# Patient Record
Sex: Female | Born: 1960 | Race: White | Hispanic: No | Marital: Married | Smoking: Never smoker
Health system: Southern US, Community
[De-identification: ages and names within clinical notes are randomized; demographics above are authoritative.]

## PROBLEM LIST (undated history)

## (undated) DIAGNOSIS — E559 Vitamin D deficiency, unspecified: Secondary | ICD-10-CM

## (undated) DIAGNOSIS — N289 Disorder of kidney and ureter, unspecified: Secondary | ICD-10-CM

## (undated) DIAGNOSIS — E7800 Pure hypercholesterolemia, unspecified: Secondary | ICD-10-CM

## (undated) DIAGNOSIS — Z800 Family history of malignant neoplasm of digestive organs: Secondary | ICD-10-CM

## (undated) DIAGNOSIS — I10 Essential (primary) hypertension: Secondary | ICD-10-CM

## (undated) DIAGNOSIS — K219 Gastro-esophageal reflux disease without esophagitis: Secondary | ICD-10-CM

## (undated) HISTORY — DX: Family history of malignant neoplasm of digestive organs: Z80.0

## (undated) HISTORY — DX: Essential (primary) hypertension: I10

## (undated) HISTORY — PX: KNEE SURGERY: SHX244

## (undated) HISTORY — DX: Vitamin D deficiency, unspecified: E55.9

---

## 2012-09-18 HISTORY — PX: COLONOSCOPY: SHX174

## 2015-01-07 ENCOUNTER — Emergency Department (HOSPITAL_COMMUNITY)

## 2015-01-07 ENCOUNTER — Encounter (HOSPITAL_COMMUNITY): Payer: Self-pay | Admitting: Emergency Medicine

## 2015-01-07 ENCOUNTER — Emergency Department (HOSPITAL_COMMUNITY)
Admission: EM | Admit: 2015-01-07 | Discharge: 2015-01-07 | Disposition: A | Discharge status: Home / Self Care | Attending: Emergency Medicine | Admitting: Emergency Medicine

## 2015-01-07 DIAGNOSIS — R52 Pain, unspecified: Secondary | ICD-10-CM

## 2015-01-07 DIAGNOSIS — R109 Unspecified abdominal pain: Secondary | ICD-10-CM | POA: Diagnosis present

## 2015-01-07 DIAGNOSIS — Z3202 Encounter for pregnancy test, result negative: Secondary | ICD-10-CM | POA: Diagnosis not present

## 2015-01-07 DIAGNOSIS — N200 Calculus of kidney: Secondary | ICD-10-CM | POA: Diagnosis not present

## 2015-01-07 HISTORY — DX: Disorder of kidney and ureter, unspecified: N28.9

## 2015-01-07 LAB — COMPREHENSIVE METABOLIC PANEL
ALBUMIN: 4.5 g/dL (ref 3.5–5.0)
ALT: 18 U/L (ref 14–54)
ANION GAP: 9 (ref 5–15)
AST: 19 U/L (ref 15–41)
Alkaline Phosphatase: 42 U/L (ref 38–126)
BILIRUBIN TOTAL: 0.8 mg/dL (ref 0.3–1.2)
BUN: 12 mg/dL (ref 6–20)
CHLORIDE: 103 mmol/L (ref 101–111)
CO2: 25 mmol/L (ref 22–32)
Calcium: 9.3 mg/dL (ref 8.9–10.3)
Creatinine, Ser: 0.88 mg/dL (ref 0.44–1.00)
GFR calc Af Amer: >60 mL/min (ref >60)
GFR calc non Af Amer: >60 mL/min (ref >60)
GLUCOSE: 141 mg/dL — AB (ref 65–99)
POTASSIUM: 3.8 mmol/L (ref 3.5–5.1)
Sodium: 137 mmol/L (ref 135–145)
TOTAL PROTEIN: 7.8 g/dL (ref 6.5–8.1)

## 2015-01-07 LAB — URINALYSIS, ROUTINE W REFLEX MICROSCOPIC
BILIRUBIN URINE: NEGATIVE
Glucose, UA: NEGATIVE mg/dL
NITRITE: NEGATIVE
Protein, ur: NEGATIVE mg/dL
Specific Gravity, Urine: >1.03 — ABNORMAL HIGH (ref 1.005–1.030)
pH: 5.5 (ref 5.0–8.0)

## 2015-01-07 LAB — URINE MICROSCOPIC-ADD ON

## 2015-01-07 LAB — CBC
HCT: 40.7 % (ref 36.0–46.0)
HEMOGLOBIN: 13.9 g/dL (ref 12.0–15.0)
MCH: 29.8 pg (ref 26.0–34.0)
MCHC: 34.2 g/dL (ref 30.0–36.0)
MCV: 87.2 fL (ref 78.0–100.0)
Platelets: 295 10*3/uL (ref 150–400)
RBC: 4.67 MIL/uL (ref 3.87–5.11)
RDW: 12.6 % (ref 11.5–15.5)
WBC: 15.9 10*3/uL — AB (ref 4.0–10.5)

## 2015-01-07 LAB — LIPASE, BLOOD: LIPASE: 30 U/L (ref 11–51)

## 2015-01-07 LAB — PREGNANCY, URINE: Preg Test, Ur: NEGATIVE

## 2015-01-07 MED ORDER — HYDROMORPHONE HCL 1 MG/ML IJ SOLN
1.0000 mg | Freq: Once | INTRAMUSCULAR | Status: DC
  Filled 2015-01-07: qty 1

## 2015-01-07 MED ORDER — TAMSULOSIN HCL 0.4 MG PO CAPS: 0.4000 mg | ORAL_CAPSULE | Freq: Once | ORAL | Status: DC

## 2015-01-07 MED ORDER — KETOROLAC TROMETHAMINE 30 MG/ML IJ SOLN
30.0000 mg | Freq: Once | INTRAMUSCULAR | Status: AC
  Administered 2015-01-07: 30 mg via INTRAVENOUS
  Filled 2015-01-07: qty 1

## 2015-01-07 MED ORDER — ONDANSETRON 4 MG PO TBDP: 4.0000 mg | ORAL_TABLET | Freq: Three times a day (TID) | ORAL | Status: DC | PRN

## 2015-01-07 MED ORDER — ONDANSETRON HCL 4 MG/2ML IJ SOLN
4.0000 mg | Freq: Once | INTRAMUSCULAR | Status: AC | PRN
  Administered 2015-01-07: 4 mg via INTRAVENOUS
  Filled 2015-01-07: qty 2

## 2015-01-07 MED ORDER — HYDROMORPHONE HCL 1 MG/ML IJ SOLN
1.0000 mg | Freq: Once | INTRAMUSCULAR | Status: AC
  Administered 2015-01-07: 1 mg via INTRAVENOUS

## 2015-01-07 MED ORDER — OXYCODONE-ACETAMINOPHEN 5-325 MG PO TABS: 2.0000 | ORAL_TABLET | ORAL | Status: DC | PRN

## 2015-01-07 MED ORDER — FENTANYL CITRATE (PF) 100 MCG/2ML IJ SOLN
50.0000 ug | Freq: Once | INTRAMUSCULAR | Status: AC
  Administered 2015-01-07: 50 ug via INTRAVENOUS
  Filled 2015-01-07: qty 2

## 2015-01-07 MED ORDER — HYDROMORPHONE HCL 1 MG/ML IJ SOLN
1.0000 mg | Freq: Once | INTRAMUSCULAR | Status: AC
  Administered 2015-01-07: 1 mg via INTRAVENOUS
  Filled 2015-01-07: qty 1

## 2015-01-07 NOTE — ED Notes (Signed)
Pt in x-ray, unsure of pregnancy. Urine pregnancy ordered.

## 2015-01-07 NOTE — Discharge Instructions (Signed)
Kidney Stones °Kidney stones (urolithiasis) are deposits that form inside your kidneys. The intense pain is caused by the stone moving through the urinary tract. When the stone moves, the ureter goes into spasm around the stone. The stone is usually passed in the urine.  °CAUSES  °· A disorder that makes certain neck glands produce too much parathyroid hormone (primary hyperparathyroidism). °· A buildup of uric acid crystals, similar to gout in your joints. °· Narrowing (stricture) of the ureter. °· A kidney obstruction present at birth (congenital obstruction). °· Previous surgery on the kidney or ureters. °· Numerous kidney infections. °SYMPTOMS  °· Feeling sick to your stomach (nauseous). °· Throwing up (vomiting). °· Blood in the urine (hematuria). °· Pain that usually spreads (radiates) to the groin. °· Frequency or urgency of urination. °DIAGNOSIS  °· Taking a history and physical exam. °· Blood or urine tests. °· CT scan. °· Occasionally, an examination of the inside of the urinary bladder (cystoscopy) is performed. °TREATMENT  °· Observation. °· Increasing your fluid intake. °· Extracorporeal shock wave lithotripsy--This is a noninvasive procedure that uses shock waves to break up kidney stones. °· Surgery may be needed if you have severe pain or persistent obstruction. There are various surgical procedures. Most of the procedures are performed with the use of small instruments. Only small incisions are needed to accommodate these instruments, so recovery time is minimized. °The size, location, and chemical composition are all important variables that will determine the proper choice of action for you. Talk to your health care provider to better understand your situation so that you will minimize the risk of injury to yourself and your kidney.  °HOME CARE INSTRUCTIONS  °· Drink enough water and fluids to keep your urine clear or pale yellow. This will help you to pass the stone or stone fragments. °· Strain  all urine through the provided strainer. Keep all particulate matter and stones for your health care provider to see. The stone causing the pain may be as small as a grain of salt. It is very important to use the strainer each and every time you pass your urine. The collection of your stone will allow your health care provider to analyze it and verify that a stone has actually passed. The stone analysis will often identify what you can do to reduce the incidence of recurrences. °· Only take over-the-counter or prescription medicines for pain, discomfort, or fever as directed by your health care provider. °· Keep all follow-up visits as told by your health care provider. This is important. °· Get follow-up X-rays if required. The absence of pain does not always mean that the stone has passed. It may have only stopped moving. If the urine remains completely obstructed, it can cause loss of kidney function or even complete destruction of the kidney. It is your responsibility to make sure X-rays and follow-ups are completed. Ultrasounds of the kidney can show blockages and the status of the kidney. Ultrasounds are not associated with any radiation and can be performed easily in a matter of minutes. °· Make changes to your daily diet as told by your health care provider. You may be told to: °¨ Limit the amount of salt that you eat. °¨ Eat 5 or more servings of fruits and vegetables each day. °¨ Limit the amount of meat, poultry, fish, and eggs that you eat. °· Collect a 24-hour urine sample as told by your health care provider. You may need to collect another urine sample every 6-12   months. °SEEK MEDICAL CARE IF: °· You experience pain that is progressive and unresponsive to any pain medicine you have been prescribed. °SEEK IMMEDIATE MEDICAL CARE IF:  °· Pain cannot be controlled with the prescribed medicine. °· You have a fever or shaking chills. °· The severity or intensity of pain increases over 18 hours and is not  relieved by pain medicine. °· You develop a new onset of abdominal pain. °· You feel faint or pass out. °· You are unable to urinate. °  °This information is not intended to replace advice given to you by your health care provider. Make sure you discuss any questions you have with your health care provider. °  °Document Released: 01/25/2005 Document Revised: 10/16/2014 Document Reviewed: 06/28/2012 °Elsevier Interactive Patient Education ©2016 [employer] ° °

## 2015-01-07 NOTE — ED Notes (Signed)
Pt c/o increasingly worsening RT flank pain, n/v since last night. Pt hx of kidney stones. Denies urinary symptoms at this time.

## 2015-01-07 NOTE — ED Provider Notes (Signed)
CSN: [id number]     Arrival date & time 01/07/15  1038 History   First MD Initiated Contact with Patient 01/07/15 1109     Chief Complaint  Patient presents with  . Flank Pain     (Consider location/radiation/quality/duration/timing/severity/associated sxs/prior Treatment) Patient is a 54 y.o. female presenting with flank pain. The history is provided by the patient. No language interpreter was used.  Flank Pain This is a new problem. The current episode started yesterday. The problem occurs constantly. The problem has been gradually worsening. Associated symptoms include nausea and vomiting. Pertinent negatives include no abdominal pain. Nothing aggravates the symptoms. She has tried nothing for the symptoms. The treatment provided moderate relief.    Past Medical History  Diagnosis Date  . Renal disorder     kidney stones   Past Surgical History  Procedure Laterality Date  . Knee surgery     No family history on file. Social History  Substance Use Topics  . Smoking status: Never Smoker   . Smokeless tobacco: None  . Alcohol Use: Yes     Comment: occ   OB History    No data available     Review of Systems  Gastrointestinal: Positive for nausea and vomiting. Negative for abdominal pain.  Genitourinary: Positive for flank pain.  All other systems reviewed and are negative.     Allergies  Review of patient's allergies indicates no known allergies.  Home Medications   Prior to Admission medications   Not on File   BP 189/95 mmHg  Pulse 71  Temp(Src) 97.7 F (36.5 C) (Oral)  Resp 20  Ht 5\' 6"  (1.676 m)  Wt 86.183 kg  BMI 30.68 kg/m2  SpO2 100%  LMP 11/09/2014 Physical Exam  Constitutional: She is oriented to person, place, and time. She appears well-developed and well-nourished.  HENT:  Head: Normocephalic.  Eyes: EOM are normal.  Neck: Normal range of motion.  Cardiovascular: Normal rate.   Pulmonary/Chest: Effort normal.  Abdominal: Soft. She  exhibits no distension.  Tender right flank,  Slight diffuse tenderness right abdomen  Musculoskeletal: Normal range of motion.  Neurological: She is alert and oriented to person, place, and time.  Psychiatric: She has a normal mood and affect.  Nursing note and vitals reviewed.   ED Course  Procedures (including critical care time) Labs Review Labs Reviewed  LIPASE, BLOOD  COMPREHENSIVE METABOLIC PANEL  CBC  URINALYSIS, ROUTINE W REFLEX MICROSCOPIC (NOT [facility])    Imaging Review Ct Renal Stone Study  01/07/2015  CLINICAL DATA:  54 year old female with history of right-sided flank pain since yesterday evening. Nausea and vomiting. EXAM: CT ABDOMEN AND PELVIS WITHOUT CONTRAST TECHNIQUE: Multidetector CT imaging of the abdomen and pelvis was performed following the standard protocol without IV contrast. COMPARISON:  No priors. FINDINGS: Lower chest:  Unremarkable. Hepatobiliary: No cystic or solid hepatic lesions are confidently identified on today's noncontrast CT examination. The unenhanced appearance of the gallbladder is normal. Pancreas: No definite pancreatic mass or peripancreatic inflammatory changes are identified on today's noncontrast CT examination. Spleen: Unremarkable. Adrenals/Urinary Tract: There appears to be 2 stones adjacent to one another in the distal third of the right ureter best appreciated on image 67 of series 2, largest of which measures 5 mm. This is associated with moderate proximal hydroureteronephrosis and extensive perinephric stranding, indicative of obstruction at this time. No additional calcifications are identified within the collecting system of the left kidney, along the course of the left ureter or within the  lumen of the urinary bladder. The unenhanced appearance of the kidneys and urinary bladder is otherwise unremarkable. Bilateral adrenal glands are normal in appearance. Stomach/Bowel: Normal appearance of the stomach. No pathologic dilatation of small  bowel or colon. Normal appendix. Vascular/Lymphatic: Mild atherosclerotic calcifications identified throughout the abdominal and pelvic vasculature, without definite aneurysm. No lymphadenopathy noted in the abdomen or pelvis on today's noncontrast CT examination. Reproductive: Uterus and ovaries are unremarkable in appearance. Other: Trace volume of free fluid in the low anatomic pelvis, likely reactive to the right-sided urinary tract obstruction. No larger volume of ascites. No pneumoperitoneum. Musculoskeletal: There are no aggressive appearing lytic or blastic lesions noted in the visualized portions of the skeleton. IMPRESSION: 1. There are 2 calculi adjacent to one another in the distal third of the right ureter, largest of which measures 5 mm. These are obstructive associated with moderate proximal right hydroureteronephrosis and extensive perinephric stranding at this time. 2. Normal appendix. 3. Mild atherosclerosis. Electronically Signed   By: Trudie Reed M.D.   On: 01/07/2015 14:13   I have personally reviewed and evaluated these images and lab results as part of my medical decision-making.   EKG Interpretation None      MDM  Pt given fentynl by Rn no relief.   Pt given Iv dilaudid  x 2 dosages.   Pt given torodol iv.   Pt reports some relief.   I advised pt to follow up with Urology for recheck.   Rx for flomax, percocet and zofran.   Pt advised to return if any problems.   Final diagnoses:  Pain  Kidney stone on right side    An After Visit Summary was printed and given to the patient.    Lonia Skinner Ivyland, PA-C 01/08/15 1018  Blane Ohara, MD 01/11/15 [phone]

## 2015-01-11 ENCOUNTER — Inpatient Hospital Stay (HOSPITAL_COMMUNITY)
Admission: EM | Admit: 2015-01-11 | Discharge: 2015-01-15 | DRG: 872 | Disposition: A | Discharge status: Home / Self Care | Attending: Family Medicine | Admitting: Family Medicine

## 2015-01-11 ENCOUNTER — Encounter (HOSPITAL_COMMUNITY): Payer: Self-pay

## 2015-01-11 DIAGNOSIS — E876 Hypokalemia: Secondary | ICD-10-CM | POA: Diagnosis not present

## 2015-01-11 DIAGNOSIS — B9620 Unspecified Escherichia coli [E. coli] as the cause of diseases classified elsewhere: Secondary | ICD-10-CM | POA: Diagnosis present

## 2015-01-11 DIAGNOSIS — Z87442 Personal history of urinary calculi: Secondary | ICD-10-CM

## 2015-01-11 DIAGNOSIS — R509 Fever, unspecified: Secondary | ICD-10-CM | POA: Diagnosis present

## 2015-01-11 DIAGNOSIS — N136 Pyonephrosis: Secondary | ICD-10-CM | POA: Diagnosis not present

## 2015-01-11 DIAGNOSIS — A4151 Sepsis due to Escherichia coli [E. coli]: Principal | ICD-10-CM | POA: Diagnosis present

## 2015-01-11 DIAGNOSIS — N201 Calculus of ureter: Secondary | ICD-10-CM

## 2015-01-11 DIAGNOSIS — D649 Anemia, unspecified: Secondary | ICD-10-CM | POA: Diagnosis not present

## 2015-01-11 DIAGNOSIS — A419 Sepsis, unspecified organism: Secondary | ICD-10-CM

## 2015-01-11 DIAGNOSIS — N138 Other obstructive and reflux uropathy: Secondary | ICD-10-CM

## 2015-01-11 DIAGNOSIS — N179 Acute kidney failure, unspecified: Secondary | ICD-10-CM | POA: Diagnosis not present

## 2015-01-11 DIAGNOSIS — N200 Calculus of kidney: Secondary | ICD-10-CM

## 2015-01-12 ENCOUNTER — Emergency Department (HOSPITAL_COMMUNITY): Admitting: Anesthesiology

## 2015-01-12 ENCOUNTER — Encounter (HOSPITAL_COMMUNITY)
Admission: EM | Disposition: A | Discharge status: Home / Self Care | Payer: Self-pay | Source: Home / Self Care | Attending: Internal Medicine

## 2015-01-12 ENCOUNTER — Encounter (HOSPITAL_COMMUNITY): Payer: Self-pay | Admitting: *Deleted

## 2015-01-12 ENCOUNTER — Observation Stay (HOSPITAL_COMMUNITY)

## 2015-01-12 ENCOUNTER — Emergency Department (HOSPITAL_COMMUNITY)

## 2015-01-12 DIAGNOSIS — D649 Anemia, unspecified: Secondary | ICD-10-CM | POA: Diagnosis present

## 2015-01-12 DIAGNOSIS — N200 Calculus of kidney: Secondary | ICD-10-CM

## 2015-01-12 DIAGNOSIS — R7881 Bacteremia: Secondary | ICD-10-CM | POA: Diagnosis not present

## 2015-01-12 DIAGNOSIS — N179 Acute kidney failure, unspecified: Secondary | ICD-10-CM | POA: Diagnosis present

## 2015-01-12 DIAGNOSIS — N139 Obstructive and reflux uropathy, unspecified: Secondary | ICD-10-CM | POA: Diagnosis not present

## 2015-01-12 DIAGNOSIS — R000 Tachycardia, unspecified: Secondary | ICD-10-CM | POA: Diagnosis not present

## 2015-01-12 DIAGNOSIS — N138 Other obstructive and reflux uropathy: Secondary | ICD-10-CM

## 2015-01-12 DIAGNOSIS — B9620 Unspecified Escherichia coli [E. coli] as the cause of diseases classified elsewhere: Secondary | ICD-10-CM | POA: Diagnosis present

## 2015-01-12 DIAGNOSIS — A4151 Sepsis due to Escherichia coli [E. coli]: Secondary | ICD-10-CM | POA: Diagnosis present

## 2015-01-12 DIAGNOSIS — R509 Fever, unspecified: Secondary | ICD-10-CM | POA: Diagnosis not present

## 2015-01-12 DIAGNOSIS — E876 Hypokalemia: Secondary | ICD-10-CM | POA: Diagnosis present

## 2015-01-12 DIAGNOSIS — N136 Pyonephrosis: Secondary | ICD-10-CM | POA: Diagnosis present

## 2015-01-12 DIAGNOSIS — Z87442 Personal history of urinary calculi: Secondary | ICD-10-CM | POA: Diagnosis not present

## 2015-01-12 DIAGNOSIS — N201 Calculus of ureter: Secondary | ICD-10-CM | POA: Diagnosis not present

## 2015-01-12 HISTORY — PX: CYSTOSCOPY W/ URETERAL STENT PLACEMENT: SHX1429

## 2015-01-12 LAB — COMPREHENSIVE METABOLIC PANEL
ALT: 36 U/L (ref 14–54)
AST: 28 U/L (ref 15–41)
Albumin: 2.8 g/dL — ABNORMAL LOW (ref 3.5–5.0)
Alkaline Phosphatase: 134 U/L — ABNORMAL HIGH (ref 38–126)
Anion gap: 12 (ref 5–15)
BUN: 21 mg/dL — AB (ref 6–20)
CHLORIDE: 99 mmol/L — AB (ref 101–111)
CO2: 23 mmol/L (ref 22–32)
Calcium: 8.6 mg/dL — ABNORMAL LOW (ref 8.9–10.3)
Creatinine, Ser: 1.36 mg/dL — ABNORMAL HIGH (ref 0.44–1.00)
GFR calc Af Amer: 50 mL/min — ABNORMAL LOW (ref >60)
GFR, EST NON AFRICAN AMERICAN: 44 mL/min — AB (ref >60)
GLUCOSE: 103 mg/dL — AB (ref 65–99)
POTASSIUM: 3 mmol/L — AB (ref 3.5–5.1)
Sodium: 134 mmol/L — ABNORMAL LOW (ref 135–145)
Total Bilirubin: 1.8 mg/dL — ABNORMAL HIGH (ref 0.3–1.2)
Total Protein: 6.2 g/dL — ABNORMAL LOW (ref 6.5–8.1)

## 2015-01-12 LAB — CBC WITH DIFFERENTIAL/PLATELET
Basophils Absolute: 0 10*3/uL (ref 0.0–0.1)
Basophils Relative: 0 %
EOS PCT: 1 %
Eosinophils Absolute: 0.1 10*3/uL (ref 0.0–0.7)
HCT: 30.6 % — ABNORMAL LOW (ref 36.0–46.0)
Hemoglobin: 10.5 g/dL — ABNORMAL LOW (ref 12.0–15.0)
LYMPHS ABS: 0.4 10*3/uL — AB (ref 0.7–4.0)
LYMPHS PCT: 5 %
MCH: 29.3 pg (ref 26.0–34.0)
MCHC: 34.3 g/dL (ref 30.0–36.0)
MCV: 85.5 fL (ref 78.0–100.0)
MONOS PCT: 5 %
Monocytes Absolute: 0.5 10*3/uL (ref 0.1–1.0)
Neutro Abs: 7.9 10*3/uL — ABNORMAL HIGH (ref 1.7–7.7)
Neutrophils Relative %: 89 %
PLATELETS: 158 10*3/uL (ref 150–400)
RBC: 3.58 MIL/uL — AB (ref 3.87–5.11)
RDW: 12.7 % (ref 11.5–15.5)
WBC: 8.8 10*3/uL (ref 4.0–10.5)

## 2015-01-12 LAB — URINE MICROSCOPIC-ADD ON

## 2015-01-12 LAB — URINALYSIS, ROUTINE W REFLEX MICROSCOPIC
BILIRUBIN URINE: NEGATIVE
Glucose, UA: NEGATIVE mg/dL
NITRITE: NEGATIVE
pH: 6 (ref 5.0–8.0)

## 2015-01-12 LAB — BASIC METABOLIC PANEL
ANION GAP: 10 (ref 5–15)
BUN: 17 mg/dL (ref 6–20)
CALCIUM: 8.2 mg/dL — AB (ref 8.9–10.3)
CO2: 24 mmol/L (ref 22–32)
CREATININE: 1.23 mg/dL — AB (ref 0.44–1.00)
Chloride: 103 mmol/L (ref 101–111)
GFR calc Af Amer: 57 mL/min — ABNORMAL LOW (ref >60)
GFR, EST NON AFRICAN AMERICAN: 49 mL/min — AB (ref >60)
GLUCOSE: 116 mg/dL — AB (ref 65–99)
Potassium: 3.7 mmol/L (ref 3.5–5.1)
Sodium: 137 mmol/L (ref 135–145)

## 2015-01-12 LAB — I-STAT CG4 LACTIC ACID, ED: LACTIC ACID, VENOUS: 1.34 mmol/L (ref 0.5–2.0)

## 2015-01-12 SURGERY — CYSTOSCOPY, WITH RETROGRADE PYELOGRAM AND URETERAL STENT INSERTION
Anesthesia: General | Site: Ureter | Laterality: Right

## 2015-01-12 MED ORDER — SODIUM CHLORIDE 0.9 % IR SOLN
Status: DC | PRN
  Administered 2015-01-12: 3000 mL via INTRAVESICAL

## 2015-01-12 MED ORDER — PROPOFOL 10 MG/ML IV BOLUS
INTRAVENOUS | Status: DC | PRN
  Administered 2015-01-12: 140 mg via INTRAVENOUS

## 2015-01-12 MED ORDER — ROCURONIUM BROMIDE 50 MG/5ML IV SOLN
INTRAVENOUS | Status: AC
  Filled 2015-01-12: qty 1

## 2015-01-12 MED ORDER — OXYBUTYNIN CHLORIDE 5 MG PO TABS: 5.0000 mg | ORAL_TABLET | Freq: Three times a day (TID) | ORAL | Status: DC | PRN

## 2015-01-12 MED ORDER — LACTATED RINGERS IV SOLN
INTRAVENOUS | Status: DC | PRN
  Administered 2015-01-12: 03:00:00 via INTRAVENOUS

## 2015-01-12 MED ORDER — ROCURONIUM BROMIDE 100 MG/10ML IV SOLN
INTRAVENOUS | Status: DC | PRN
  Administered 2015-01-12: 5 mg via INTRAVENOUS

## 2015-01-12 MED ORDER — FENTANYL CITRATE (PF) 250 MCG/5ML IJ SOLN
INTRAMUSCULAR | Status: AC
  Filled 2015-01-12: qty 5

## 2015-01-12 MED ORDER — DEXTROSE 5 % IV SOLN
2.0000 g | Freq: Once | INTRAVENOUS | Status: AC
  Administered 2015-01-12: 2 g via INTRAVENOUS
  Filled 2015-01-12: qty 2

## 2015-01-12 MED ORDER — ONDANSETRON HCL 4 MG/2ML IJ SOLN
INTRAMUSCULAR | Status: DC | PRN
  Administered 2015-01-12: 4 mg via INTRAVENOUS

## 2015-01-12 MED ORDER — PIPERACILLIN-TAZOBACTAM 3.375 G IVPB
3.3750 g | Freq: Three times a day (TID) | INTRAVENOUS | Status: DC
  Administered 2015-01-12 – 2015-01-15 (×8): 3.375 g via INTRAVENOUS
  Filled 2015-01-12 (×11): qty 50

## 2015-01-12 MED ORDER — BISACODYL 5 MG PO TBEC: 5.0000 mg | DELAYED_RELEASE_TABLET | Freq: Every day | ORAL | Status: DC | PRN

## 2015-01-12 MED ORDER — MIDAZOLAM HCL 5 MG/5ML IJ SOLN
INTRAMUSCULAR | Status: DC | PRN
  Administered 2015-01-12: 2 mg via INTRAVENOUS

## 2015-01-12 MED ORDER — LIDOCAINE HCL (PF) 1 % IJ SOLN
INTRAMUSCULAR | Status: AC
  Filled 2015-01-12: qty 5

## 2015-01-12 MED ORDER — PROPOFOL 10 MG/ML IV BOLUS
INTRAVENOUS | Status: AC
  Filled 2015-01-12: qty 20

## 2015-01-12 MED ORDER — HYDROCODONE-ACETAMINOPHEN 5-325 MG PO TABS
1.0000 | ORAL_TABLET | ORAL | Status: DC | PRN
  Administered 2015-01-12 (×3): 1 via ORAL
  Filled 2015-01-12 (×3): qty 1

## 2015-01-12 MED ORDER — SUCCINYLCHOLINE CHLORIDE 20 MG/ML IJ SOLN
INTRAMUSCULAR | Status: DC | PRN
  Administered 2015-01-12: 100 mg via INTRAVENOUS

## 2015-01-12 MED ORDER — ALUM & MAG HYDROXIDE-SIMETH 200-200-20 MG/5ML PO SUSP: 30.0000 mL | Freq: Four times a day (QID) | ORAL | Status: DC | PRN

## 2015-01-12 MED ORDER — ONDANSETRON HCL 4 MG/2ML IJ SOLN: 4.0000 mg | Freq: Four times a day (QID) | INTRAMUSCULAR | Status: DC | PRN

## 2015-01-12 MED ORDER — PROPOFOL 10 MG/ML IV BOLUS: INTRAVENOUS | Status: DC | PRN

## 2015-01-12 MED ORDER — ONDANSETRON HCL 4 MG/2ML IJ SOLN
INTRAMUSCULAR | Status: AC
  Filled 2015-01-12: qty 2

## 2015-01-12 MED ORDER — POTASSIUM CHLORIDE IN NACL 20-0.9 MEQ/L-% IV SOLN
INTRAVENOUS | Status: AC
  Administered 2015-01-12: 23:00:00 via INTRAVENOUS

## 2015-01-12 MED ORDER — FENTANYL CITRATE (PF) 250 MCG/5ML IJ SOLN
INTRAMUSCULAR | Status: DC | PRN
  Administered 2015-01-12 (×2): 50 ug via INTRAVENOUS

## 2015-01-12 MED ORDER — IOHEXOL 350 MG/ML SOLN
INTRAVENOUS | Status: DC | PRN
  Administered 2015-01-12: 10 mL via URETHRAL

## 2015-01-12 MED ORDER — DEXTROSE 5 % IV SOLN
INTRAVENOUS | Status: AC
  Filled 2015-01-12: qty 10

## 2015-01-12 MED ORDER — ONDANSETRON HCL 4 MG PO TABS: 4.0000 mg | ORAL_TABLET | Freq: Four times a day (QID) | ORAL | Status: DC | PRN

## 2015-01-12 MED ORDER — LIDOCAINE HCL (CARDIAC) 20 MG/ML IV SOLN
INTRAVENOUS | Status: DC | PRN
  Administered 2015-01-12: 40 mg via INTRAVENOUS

## 2015-01-12 MED ORDER — PIPERACILLIN-TAZOBACTAM 3.375 G IVPB
INTRAVENOUS | Status: AC
  Filled 2015-01-12: qty 100

## 2015-01-12 MED ORDER — DEXTROSE 5 % IV SOLN
1.0000 g | INTRAVENOUS | Status: DC
  Filled 2015-01-12: qty 10

## 2015-01-12 MED ORDER — SUCCINYLCHOLINE CHLORIDE 20 MG/ML IJ SOLN
INTRAMUSCULAR | Status: AC
  Filled 2015-01-12: qty 1

## 2015-01-12 MED ORDER — ONDANSETRON 4 MG PO TBDP: 4.0000 mg | ORAL_TABLET | Freq: Three times a day (TID) | ORAL | Status: DC | PRN

## 2015-01-12 MED ORDER — OXYCODONE-ACETAMINOPHEN 5-325 MG PO TABS: 2.0000 | ORAL_TABLET | ORAL | Status: DC | PRN

## 2015-01-12 MED ORDER — MIDAZOLAM HCL 2 MG/2ML IJ SOLN
INTRAMUSCULAR | Status: AC
  Filled 2015-01-12: qty 2

## 2015-01-12 MED ORDER — POTASSIUM CHLORIDE IN NACL 20-0.9 MEQ/L-% IV SOLN
INTRAVENOUS | Status: AC
  Administered 2015-01-12: 06:00:00 via INTRAVENOUS

## 2015-01-12 SURGICAL SUPPLY — 18 items
BAG DRAIN URO TABLE W/ADPT NS (DRAPE) ×2 IMPLANT
BAG HAMPER (MISCELLANEOUS) ×2 IMPLANT
CATH INTERMIT  6FR 70CM (CATHETERS) ×2 IMPLANT
CLOTH BEACON ORANGE TIMEOUT ST (SAFETY) ×2 IMPLANT
DECANTER SPIKE VIAL GLASS SM (MISCELLANEOUS) ×2 IMPLANT
GLOVE BIOGEL M 8.0 STRL (GLOVE) ×2 IMPLANT
GLOVE BIOGEL PI IND STRL 7.0 (GLOVE) ×2 IMPLANT
GLOVE BIOGEL PI INDICATOR 7.0 (GLOVE) ×2
GLOVE ECLIPSE 6.5 STRL STRAW (GLOVE) ×2 IMPLANT
GOWN STRL REIN XL XLG (GOWN DISPOSABLE) ×2 IMPLANT
GUIDEWIRE STR DUAL SENSOR (WIRE) ×2 IMPLANT
IV NS IRRIG 3000ML ARTHROMATIC (IV SOLUTION) ×2 IMPLANT
KIT ROOM TURNOVER AP CYSTO (KITS) ×2 IMPLANT
MANIFOLD NEPTUNE II (INSTRUMENTS) ×2 IMPLANT
PACK CYSTO (CUSTOM PROCEDURE TRAY) ×2 IMPLANT
PAD ARMBOARD 7.5X6 YLW CONV (MISCELLANEOUS) ×2 IMPLANT
STENT CONTOUR 6FRX24X.038 (STENTS) ×2 IMPLANT
TOWEL OR 17X26 4PK STRL BLUE (TOWEL DISPOSABLE) ×2 IMPLANT

## 2015-01-12 NOTE — ED Provider Notes (Signed)
CSN: [id number]     Arrival date & time 01/11/15  2334 History  By signing my name below, I, Carolyn Patrick, attest that this documentation has been prepared under the direction and in the presence of Zadie Rhine, MD. Electronically Signed: Bethel Patrick, ED Scribe. 01/12/2015. 12:38 AM   Chief Complaint - fever  Patient is a 54 y.o. female presenting with fever. The history is provided by the spouse. No language interpreter was used.  Fever Max temp prior to arrival:  105 Temp source:  Oral Timing:  Constant Progression:  Improving Chronicity:  New Relieved by:  Ibuprofen Worsened by:  Nothing tried Ineffective treatments:  None tried Associated symptoms: chills, headaches, nausea and vomiting   Associated symptoms: no diarrhea    Carolyn Patrick is a 54 y.o. female who presents to the Emergency Department complaining of fever up to 105 with onset tonight. Pt states that she used ibuprofen PTA. She was seen for kidney stones 5 days ago and states that she has had chills since then. Her flank and abdominal pain have improved since being seen and is rated 4/10 in severity at present.  Associated symptoms include fatigue, decreased appetite, nausea, vomiting, and headache.  She is currently menstruating and unable to determine if she has hematuria. Pt denies diarrhea.  In the past she was able to pass kidney stones without difficulty. Pt drank water on the way to the ED and last ate yesterday. She has no local urology f/u.   Past Medical History  Diagnosis Date  . Renal disorder     kidney stones   Past Surgical History  Procedure Laterality Date  . Knee surgery     No family history on file. Social History  Substance Use Topics  . Smoking status: Never Smoker   . Smokeless tobacco: None  . Alcohol Use: Yes     Comment: occ   OB History    No data available     Review of Systems  Constitutional: Positive for fever, chills, appetite change and fatigue.  Gastrointestinal:  Positive for nausea, vomiting and abdominal pain. Negative for diarrhea.  Genitourinary: Positive for flank pain.  Neurological: Positive for headaches.  All other systems reviewed and are negative.  Allergies  Review of patient's allergies indicates no known allergies.  Home Medications   Prior to Admission medications   Medication Sig Start Date End Date Taking? Authorizing Provider  ondansetron (ZOFRAN ODT) 4 MG disintegrating tablet Take 1 tablet (4 mg total) by mouth every 8 (eight) hours as needed for nausea or vomiting. 01/07/15  Yes Elson Areas, PA-C  oxyCODONE-acetaminophen (PERCOCET/ROXICET) 5-325 MG tablet Take 2 tablets by mouth every 4 (four) hours as needed for severe pain. 01/07/15  Yes Lonia Skinner Sofia, PA-C  tamsulosin (FLOMAX) 0.4 MG CAPS capsule Take 1 capsule (0.4 mg total) by mouth once. 01/07/15  Yes Lonia Skinner Sofia, PA-C  bisacodyl (DULCOLAX) 5 MG EC tablet Take 5 mg by mouth daily as needed for mild constipation.    Historical Provider, MD  hydrochlorothiazide (HYDRODIURIL) 25 MG tablet Take 25 mg by mouth daily.    Historical Provider, MD   BP 127/87 mmHg  Pulse 132  Temp(Src) 100.2 F (37.9 C) (Oral)  Resp 20  Ht  (1.676 m)  Wt 190 lb (86.183 kg)  BMI 30.68 kg/m2  SpO2 100%  LMP 11/09/2014  Physical Exam CONSTITUTIONAL: ill apearing HEAD: Normocephalic/atraumatic EYES: EOMI ENMT: Mucous membranes dry NECK: supple no meningeal signs SPINE/BACK:entire spine nontender  CV: S1/S2 noted, no murmurs/rubs/gallops noted LUNGS: Lungs are clear to auscultation bilaterally, no apparent distress ABDOMEN: soft, nontender, no rebound or guarding, bowel sounds noted throughout abdomen ZO:XWRUEGU:Right cva tenderness NEURO: Pt is awake/alert/appropriate, moves all extremitiesx4.  No facial droop.   EXTREMITIES: pulses normal/equal, full ROM SKIN: warm, color normal PSYCH: no abnormalities of mood noted, alert and oriented to situation  ED Course  Procedures   Medications  cefTRIAXone (ROCEPHIN) 2 g in dextrose 5 % 50 mL IVPB (2 g Intravenous New Bag/Given 01/12/15 0036)     DIAGNOSTIC STUDIES: Oxygen Saturation is 100% on RA,  normal by my interpretation.    COORDINATION OF CARE: 12:34 AM Discussed treatment plan which includes lab work, Rocephin, and IVF with pt at bedside and pt agreed to plan.   2:14 AM discussed case with dr Retta Dionesdahlstedt who has seen patient Pt with recent ureteral stone on right, now with fever/chills/flank pain. Concern for infected stone IV rocephin ordered and cultures sent Pt will be admitted and likely go to OR with urology Pt stabilized in the ER Labs Review Labs Reviewed  URINALYSIS, ROUTINE W REFLEX MICROSCOPIC (NOT AT [facility]) - Abnormal; Notable for the following:    Specific Gravity, Urine <1.005 (*)    Hgb urine dipstick LARGE (*)    Ketones, ur TRACE (*)    Protein, ur TRACE (*)    Leukocytes, UA SMALL (*)    All other components within normal limits  URINE MICROSCOPIC-ADD ON - Abnormal; Notable for the following:    Squamous Epithelial / LPF 0-5 (*)    Bacteria, UA FEW (*)    All other components within normal limits  CULTURE, BLOOD (ROUTINE X 2)  CULTURE, BLOOD (ROUTINE X 2)  URINE CULTURE  COMPREHENSIVE METABOLIC PANEL  CBC WITH DIFFERENTIAL/PLATELET  I-STAT CG4 LACTIC ACID, ED    I have personally reviewed and evaluated these lab results as part of my medical decision-making.    MDM   Final diagnoses:  Sepsis, due to unspecified organism [facility](HCC)  Right ureteral stone    Nursing notes including past medical history and social history reviewed and considered in documentation Labs/vital reviewed myself and considered during evaluation    I personally performed the services described in this documentation, which was scribed in my presence. The recorded information has been reviewed and is accurate.       Zadie Rhineonald Kazoua Gossen, MD 01/12/15 [phone]

## 2015-01-12 NOTE — ED Notes (Signed)
Dr Hillis Rangeahlstadt here to see pt for procedure

## 2015-01-12 NOTE — ED Notes (Signed)
SCD's applied to pt's legs

## 2015-01-12 NOTE — Progress Notes (Signed)
CRITICAL VALUE ALERT  Critical value received:  + BC Aerobic Gram Negative Rods  Date of notification:  01/12/15  Time of notification:  1914  Critical value read back:Yes.    Nurse who received alert:  Dagoberto LigasJessica Anelle Parlow, RN  MD notified (1st page):  L. [place], Mid Level  Time of first page:  1923  MD notified (2nd page):  Time of second page:  Responding MD:    Time MD responded:

## 2015-01-12 NOTE — Anesthesia Postprocedure Evaluation (Signed)
Anesthesia Post Note  Patient: Carolyn Patrick  Procedure(s) Performed: Procedure(s) (LRB): CYSTOSCOPY WITH RETROGRADE PYELOGRAM/URETERAL STENT PLACEMENT (Right)  Patient location during evaluation: PACU Anesthesia Type: General Level of consciousness: awake and alert and oriented Pain management: pain level controlled Vital Signs Assessment: post-procedure vital signs reviewed and stable Respiratory status: spontaneous breathing Cardiovascular status: blood pressure returned to baseline and stable Postop Assessment: adequate PO intake Anesthetic complications: no    Last Vitals:  Filed Vitals:   01/12/15 0300 01/12/15 0400  BP: 118/68 104/67  Pulse: 99 97  Temp:  36.9 C  Resp: 23 20    Last Pain:  Filed Vitals:   01/12/15 0409  PainSc: 4                  Kaelon Weekes

## 2015-01-12 NOTE — H&P (Signed)
PCP:   No primary care provider on file.   Chief Complaint:  fever  HPI: 54 yo female h/o kidney stones was diagnosed with a stone couple of days ago, thought it would pass.  On Saturday she started having high fever over 104 with associated chills, and feeling awful.  She still had the right sided abd pain with right flank pain.  No n/v.  Found to have obstructing stone on the right with mild acute kidney injury.  Urology came in and placed stent right ureter.    Review of Systems:  Positive and negative as per HPI otherwise all other systems are negative  Past Medical History: Past Medical History  Diagnosis Date  . Renal disorder     kidney stones   Past Surgical History  Procedure Laterality Date  . Knee surgery      Medications: Prior to Admission medications   Medication Sig Start Date End Date Taking? Authorizing Provider  ondansetron (ZOFRAN ODT) 4 MG disintegrating tablet Take 1 tablet (4 mg total) by mouth every 8 (eight) hours as needed for nausea or vomiting. 01/07/15  Yes Elson Areas, PA-C  oxyCODONE-acetaminophen (PERCOCET/ROXICET) 5-325 MG tablet Take 2 tablets by mouth every 4 (four) hours as needed for severe pain. 01/07/15  Yes Lonia Skinner Sofia, PA-C  tamsulosin (FLOMAX) 0.4 MG CAPS capsule Take 1 capsule (0.4 mg total) by mouth once. 01/07/15  Yes Lonia Skinner Sofia, PA-C  bisacodyl (DULCOLAX) 5 MG EC tablet Take 5 mg by mouth daily as needed for mild constipation.    Historical Provider, MD  hydrochlorothiazide (HYDRODIURIL) 25 MG tablet Take 25 mg by mouth daily.    Historical Provider, MD    Allergies:  No Known Allergies  Social History:  reports that she has never smoked. She does not have any smokeless tobacco history on file. She reports that she drinks alcohol. She reports that she does not use illicit drugs.  Family History: No premature CAD  Physical Exam: Filed Vitals:   01/12/15 0245 01/12/15 0300 01/12/15 0400 01/12/15 0450  BP:  118/68  104/67 125/63  Pulse: 99 99 97 95  Temp:   98.4 F (36.9 C) 98.4 F (36.9 C)  TempSrc:    Oral  Resp: Height:     (1.676 m)  Weight:    102.558 kg (226 lb 1.6 oz)  SpO2: 96% 96% 100% 97%   General appearance: alert, cooperative and no distress Head: Normocephalic, without obvious abnormality, atraumatic Eyes: negative Nose: Nares normal. Septum midline. Mucosa normal. No drainage or sinus tenderness. Neck: no JVD and supple, symmetrical, trachea midline Lungs: clear to auscultation bilaterally Heart: regular rate and rhythm, S1, S2 normal, no murmur, click, rub or gallop Abdomen: soft, non-tender; bowel sounds normal; no masses,  no organomegaly Extremities: extremities normal, atraumatic, no cyanosis or edema Pulses: 2+ and symmetric Skin: Skin color, texture, turgor normal. No rashes or lesions Neurologic: Grossly normal    Labs on Admission:   Recent Labs  01/12/15 0155  NA 134*  K 3.0*  CL 99*  CO2 23  GLUCOSE 103*  BUN 21*  CREATININE 1.36*  CALCIUM 8.6*    Recent Labs  01/12/15 0155  AST 28  ALT 36  ALKPHOS 134*  BILITOT 1.8*  PROT 6.2*  ALBUMIN 2.8*    Recent Labs  01/12/15 0155  WBC 8.8  NEUTROABS 7.9*  HGB 10.5*  HCT 30.6*  MCV 85.5  PLT 158   Radiological  Exams on Admission: Dg Chest Port 1 View  01/12/2015  CLINICAL DATA:  Fever, possible kidney stone. EXAM: PORTABLE CHEST 1 VIEW COMPARISON:  None. FINDINGS: Cardiomediastinal silhouette is unremarkable for this low inspiratory portable examination with crowded vasculature markings. The lungs are clear without pleural effusions or focal consolidations. Trachea projects midline and there is no pneumothorax. Included soft tissue planes and osseous structures are non-suspicious. Tiny calcification projecting superior to the LEFT humeral head can be seen with calcific tendinopathy. IMPRESSION: No active disease. Electronically Signed   By: Awilda Metroourtnay  Bloomer M.D.   On: 01/12/2015  02:51   Ct Renal Stone Study  01/07/2015  CLINICAL DATA:  54 year old female with history of right-sided flank pain since yesterday evening. Nausea and vomiting. EXAM: CT ABDOMEN AND PELVIS WITHOUT CONTRAST TECHNIQUE: Multidetector CT imaging of the abdomen and pelvis was performed following the standard protocol without IV contrast. COMPARISON:  No priors. FINDINGS: Lower chest:  Unremarkable. Hepatobiliary: No cystic or solid hepatic lesions are confidently identified on today's noncontrast CT examination. The unenhanced appearance of the gallbladder is normal. Pancreas: No definite pancreatic mass or peripancreatic inflammatory changes are identified on today's noncontrast CT examination. Spleen: Unremarkable. Adrenals/Urinary Tract: There appears to be 2 stones adjacent to one another in the distal third of the right ureter best appreciated on image 67 of series 2, largest of which measures 5 mm. This is associated with moderate proximal hydroureteronephrosis and extensive perinephric stranding, indicative of obstruction at this time. No additional calcifications are identified within the collecting system of the left kidney, along the course of the left ureter or within the lumen of the urinary bladder. The unenhanced appearance of the kidneys and urinary bladder is otherwise unremarkable. Bilateral adrenal glands are normal in appearance. Stomach/Bowel: Normal appearance of the stomach. No pathologic dilatation of small bowel or colon. Normal appendix. Vascular/Lymphatic: Mild atherosclerotic calcifications identified throughout the abdominal and pelvic vasculature, without definite aneurysm. No lymphadenopathy noted in the abdomen or pelvis on today's noncontrast CT examination. Reproductive: Uterus and ovaries are unremarkable in appearance. Other: Trace volume of free fluid in the low anatomic pelvis, likely reactive to the right-sided urinary tract obstruction. No larger volume of ascites. No  pneumoperitoneum. Musculoskeletal: There are no aggressive appearing lytic or blastic lesions noted in the visualized portions of the skeleton. IMPRESSION: 1. There are 2 calculi adjacent to one another in the distal third of the right ureter, largest of which measures 5 mm. These are obstructive associated with moderate proximal right hydroureteronephrosis and extensive perinephric stranding at this time. 2. Normal appendix. 3. Mild atherosclerosis. Electronically Signed   By: Trudie Reedaniel  Entrikin M.D.   On: 01/07/2015 14:13   Case discussed with urologist dr dalstedht   Assessment/Plan  54 yo female with obstructing right ureteral stone with AKI s/p ureteral stent placement  Principal Problem:   Urinary tract obstruction due to kidney stone-  Will likely early sepsis/bacteremia.  Place on iv rocephin.  obs on medical bed.  Urine cx pending.  Follow up per urology for eventual stent removal.  Active Problems:   AKI (acute kidney injury) (HCC)-  IVF overnight.  Should normalize.  obs on medical.  Full code.    Amahd Morino A 01/12/2015, 5:04 AM

## 2015-01-12 NOTE — Anesthesia Procedure Notes (Signed)
Procedure Name: Intubation Date/Time: 01/12/2015 3:27 AM Performed by: Carolyn Patrick, Carolyn Patrick Pre-anesthesia Checklist: Patient identified, Patient being monitored, Timeout performed, Emergency Drugs available and Suction available Patient Re-evaluated:Patient Re-evaluated prior to inductionOxygen Delivery Method: Circle System Utilized Preoxygenation: Pre-oxygenation with 100% oxygen Intubation Type: IV induction, Rapid sequence and Cricoid Pressure applied Ventilation: Mask ventilation without difficulty Laryngoscope Size: Mac and 3 Grade View: Grade I Tube type: Oral Tube size: 7.0 mm Number of attempts: 1 Airway Equipment and Method: Stylet Placement Confirmation: ETT inserted through vocal cords under direct vision,  positive ETCO2 and breath sounds checked- equal and bilateral Secured at: 21 cm Tube secured with: Tape Dental Injury: Teeth and Oropharynx as per pre-operative assessment

## 2015-01-12 NOTE — Transfer of Care (Signed)
Immediate Anesthesia Transfer of Care Note  Patient: Carolyn Patrick  Procedure(s) Performed: Procedure(s): CYSTOSCOPY WITH RETROGRADE PYELOGRAM/URETERAL STENT PLACEMENT (Right)  Patient Location: PACU  Anesthesia Type:General  Level of Consciousness: awake  Airway & Oxygen Therapy: Patient Spontanous Breathing and Patient connected to face mask oxygen  Post-op Assessment: Report given to RN  Post vital signs: Reviewed and stable  Last Vitals:  Filed Vitals:   01/12/15 0200 01/12/15 0230  BP: 105/66 108/67  Pulse: 105 98  Temp:    Resp: 21 26    Complications: No apparent anesthesia complications

## 2015-01-12 NOTE — Op Note (Signed)
Preoperative diagnosis: Right ureteral calculi, fever, possible UTI  Postoperative diagnosis: Same  Principal procedure: Cystoscopy, right retrograde ureteropyelogram, right double-J stent placement (6 [place] by 24 cm)  Surgeon: Retta Dionesahlstedt  Anesthesia: Gen.  Complications: None  Specimens: None  History: 54 year old female with known right distal ureteral stone, treated here about 5 days ago. She was sent home with the plan of medical expulsive therapy. She has developed recent fever, up to 104 at home. She is a additionally had tachycardia and persistent pain. The patient presented again to the emergency room here in [place] several hours ago, and assessment revealed persistent pain. She does have tachycardia and slight leukocytosis. Because of her fever and the possibility of an infection with an obstructing stone, it was recommended that we proceed urgently with cystoscopy and stent placement. Risks and complications as well as alternatives have been discussed with the patient and her husband, who agreed to proceed.  Procedure: The patient had received 1 g of Rocephin intravenously in the emergency room. Her right side was appropriately marked. She was then taken the operating room where general anesthetic was administered. She was placed in the dorsolithotomy position. Genitalia and perineum were prepped and draped. Proper timeout was performed.  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 I then dilated the meatus with the obturator of the scope, then more easily passed the scope. The bladder was then inspected circumferentially with the 12 lens. It appeared normal. Ureteral orifices were normal in configuration and location. There are no urothelial lesions or stones.  I then performed gentle retrograde ureteropyelogram on the right using a 6 [place] open-ended catheter with Omnipaque. There was an obvious filling defect right at  the ureterovesical junction. There was significant proximal hydroureter. I emptied over 10 mL of Omnipaque in the ureter without filling the proximal half, however I stopped at this point as the patient did have hydronephrosis.  Through the open-ended catheter, I advanced a 0.038 inch sensor-tip guidewire up in the right renal pelvis using fluoroscopic guidance. The open-ended catheter was removed and then I placed a 24 cm x 6 French contour double-J stent without the string. Proximal and distal curls were then seen properly after the wire was removed. I then noted pus coming out of the distal end of the stent.  The bladder was drained, the scope removed, and the procedure was terminated. The patient was awakened and then taken to the PACU in stable condition. She tolerated the procedure well.

## 2015-01-12 NOTE — Anesthesia Preprocedure Evaluation (Signed)
Anesthesia Evaluation  Patient identified by MRN, date of birth, ID band Patient awake    Reviewed: Allergy & Precautions, NPO status , Patient's Chart, lab work & pertinent test results  History of Anesthesia Complications Negative for: history of anesthetic complications  Airway Mallampati: I  TM Distance: >3 FB Neck ROM: Full    Dental  (+) Teeth Intact   Pulmonary neg pulmonary ROS,    breath sounds clear to auscultation       Cardiovascular Exercise Tolerance: Good  Rhythm:Regular Rate:Normal     Neuro/Psych    GI/Hepatic Neg liver ROS, neg GERD  ,  Endo/Other    Renal/GU    Renal calculi    Musculoskeletal   Abdominal   Peds  Hematology   Anesthesia Other Findings   Reproductive/Obstetrics                             Anesthesia Physical Anesthesia Plan  ASA: II and emergent  Anesthesia Plan: General   Post-op Pain Management:    Induction: Intravenous, Rapid sequence and Cricoid pressure planned  Airway Management Planned: Oral ETT  Additional Equipment:   Intra-op Plan:   Post-operative Plan: Extubation in OR  Informed Consent:   Plan Discussed with: Surgeon  Anesthesia Plan Comments:         Anesthesia Quick Evaluation

## 2015-01-12 NOTE — H&P (Signed)
Urology Consult   Physician requesting consult: Zadie Rhineonald Wickline, M.D.  Reason for consult: Kidney stone  History of Present Illness: Carolyn Patrick is a [age] y.o. female who presented to the emergency room here and [place] approximately 3 hours ago with history of kidney stone, shakes, chills, right flank pain, fever to 104 at home. She had a temperature over 100 here in the emergency room with associated tachycardia. The patient was seen approximately 5 days ago in the same emergency room with a new diagnosis of right ureteral calculi. She had mild amount of bacteria in her urine but no other sign of a kidney/bladder infection. She was sent home appropriately on pain medicine. She has had intermittent flank pain since that time, but has recently developed shakes and chills and her associated fever. She is probably taking the emergency room at this time for cystoscopy, retrograde ureteropyelogram and stent placement for appropriate right renal drainage and a hydronephrotic system with probable urinary tract infection.  She does have a remote history of kidney stones, but has not had any stones within the past few years. She has no underlying medical illnesses.  Past Medical History  Diagnosis Date  . Renal disorder     kidney stones    Past Surgical History  Procedure Laterality Date  . Knee surgery       Current Hospital Medications: Scheduled Meds: Continuous Infusions: PRN Meds:.  Allergies: No Known Allergies  No family history on file.  Social History:  reports that she has never smoked. She does not have any smokeless tobacco history on file. She reports that she drinks alcohol. She reports that she does not use illicit drugs.  ROS: A complete review of systems was performed.  All systems are negative except for pertinent findings as noted.  Physical Exam:  Vital signs in last 24 hours: Temp:  [100.2 F (37.9 C)] 100.2 F (37.9 C) (12/03 2356) Pulse Rate:  [132] 132  (12/03 2356) Resp:  [20] 20 (12/03 2356) BP: (127)/(87) 127/87 mmHg (12/03 2356) SpO2:  [100 %] 100 % (12/03 2356) Weight:  [86.183 kg (190 lb)] 86.183 kg (190 lb) (12/03 2356) General:  Alert and oriented, in mild distress HEENT: Normocephalic, atraumatic Neck: No JVD or lymphadenopathy Cardiovascular: Normal rhythm, tachycardic Lungs: Clear bilaterally with normal inspiratory and expiratory excursion Abdomen: Soft, slightly obese. Right lower quadrant and CVA tenderness. No rebound or guarding. Extremities: No edema Neurologic: Grossly intact  Laboratory Data:  No results for input(s): WBC, HGB, HCT, PLT in the last 72 hours.  No results for input(s): NA, K, CL, GLUCOSE, BUN, CALCIUM, CREATININE in the last 72 hours.  Invalid input(s): CO3   Results for orders placed or performed during the hospital encounter of 01/11/15 (from the past 24 hour(s))  Urinalysis, Routine w reflex microscopic ([facility])     Status: Abnormal   Collection Time: 01/12/15 12:06 AM  Result Value Ref Range   Color, Urine YELLOW YELLOW   APPearance CLEAR CLEAR   Specific Gravity, Urine <1.005 (L) 1.005 - 1.030   pH 6.0 5.0 - 8.0   Glucose, UA NEGATIVE NEGATIVE mg/dL   Hgb urine dipstick LARGE (A) NEGATIVE   Bilirubin Urine NEGATIVE NEGATIVE   Ketones, ur TRACE (A) NEGATIVE mg/dL   Protein, ur TRACE (A) NEGATIVE mg/dL   Nitrite NEGATIVE NEGATIVE   Leukocytes, UA SMALL (A) NEGATIVE  Urine microscopic-add on     Status: Abnormal   Collection Time: 01/12/15 12:06 AM  Result Value Ref Range  Squamous Epithelial / LPF 0-5 (A) NONE SEEN   WBC, UA 0-5 0 - 5 WBC/hpf   RBC / HPF 6-30 0 - 5 RBC/hpf   Bacteria, UA FEW (A) NONE SEEN  I-Stat CG4 Lactic Acid, ED  (not at  [facility])     Status: None   Collection Time: 01/12/15  1:08 AM  Result Value Ref Range   Lactic Acid, Venous 1.34 0.5 - 2.0 mmol/L   No results found for this or any previous visit (from the past 240 hour(s)).  Renal Function:  Recent  Labs  01/07/15 1101  CREATININE 0.88   Estimated Creatinine Clearance: 81.8 mL/min (by C-G formula based on Cr of 0.88).  Radiologic Imaging: I reviewed most recent CT images with the patient and her husband. There is a 5-6 mm right distal ureteral stone on prior CT scan from earlier in the week.   I independently reviewed the above imaging studies.  Impression/Assessment:  Right ureteral stone, persistent with new onset fever and tachycardia. Although urine does not look infected, I am concerned that she has pyelonephritis in her obstructed right renal system  Plan:  I've discussed urgent management with the patient and her husband, who was present for the exam and evaluation today. I would suggest we proceed at this point with cystoscopy, general right retrograde ureteropyelogram and double-J stent placement. The need for hospitalization for at least a short period of time postoperatively was discussed as well. They agree to the procedure, having been instructed on the option of percutaneous nephrostomy tube placement, as well as the low risk of ureteral injury and anesthetic administration.

## 2015-01-12 NOTE — Progress Notes (Signed)
[facility] PROGRESS NOTE  Carolyn Patrick ZOX:[id number] DOB: 1960-03-19 DOA: 01/11/2015  PCP: No primary care provider on file.  Brief HPI: 54 year old Caucasian female with a past medical history of nephrolithiasis who presented to the emergency department with the flank pain a few days ago. She was diagnosed as having stones in the right ureter. Patient was treated symptomatically and was discharged from the emergency department. However, she presented again due to persistent pain as well as fever of 104F. She was seen by urology and underwent a stent placement and was hospitalized for further management.  Past medical history:  Past Medical History  Diagnosis Date  . Renal disorder     kidney stones    Consultants: Urology  Procedures: Cystoscopy, right retrograde ureteropyelogram, right double-J stent placement (6 [place] by 24 cm) 12/4  Antibiotics: Ceftriaxone  Subjective: Patient feels much better this morning. No nausea. Pain is improved. Passing urine. Eating her breakfast this morning.  Objective: Vital Signs  Filed Vitals:   01/12/15 0245 01/12/15 0300 01/12/15 0400 01/12/15 0450  BP:  118/68 104/67 125/63  Pulse: 99 99 97 95  Temp:   98.4 F (36.9 C) 98.4 F (36.9 C)  TempSrc:    Oral  Resp: Height:     (1.676 m)  Weight:    102.558 kg (226 lb 1.6 oz)  SpO2: 96% 96% 100% 97%    Intake/Output Summary (Last 24 hours) at 01/12/15 0843 Last data filed at 01/12/15 0401  Gross per 24 hour  Intake    600 ml  Output      0 ml  Net    600 ml   Filed Weights   01/11/15 2356 01/12/15 0450  Weight: 86.183 kg (190 lb) 102.558 kg (226 lb 1.6 oz)    General appearance: alert, cooperative, appears stated age, no distress and moderately obese Resp: clear to auscultation bilaterally Cardio: regular rate and rhythm, S1, S2 normal, no murmur, click, rub or gallop GI: soft, non-tender; bowel sounds normal; no masses,  no  organomegaly Extremities: extremities normal, atraumatic, no cyanosis or edema Neurologic: Alert and oriented 3. No focal neurological deficits are noted.  Lab Results:  Basic Metabolic Panel:  Recent Labs Lab 01/07/15 1101 01/12/15 0155  NA 137 134*  K 3.8 3.0*  CL 103 99*  CO2 25 23  GLUCOSE 141* 103*  BUN 12 21*  CREATININE 0.88 1.36*  CALCIUM 9.3 8.6*   Liver Function Tests:  Recent Labs Lab 01/07/15 1101 01/12/15 0155  AST 19 28  ALT 18 36  ALKPHOS 42 134*  BILITOT 0.8 1.8*  PROT 7.8 6.2*  ALBUMIN 4.5 2.8*    Recent Labs Lab 01/07/15 1101  LIPASE 30   CBC:  Recent Labs Lab 01/07/15 1101 01/12/15 0155  WBC 15.9* 8.8  NEUTROABS  --  7.9*  HGB 13.9 10.5*  HCT 40.7 30.6*  MCV 87.2 85.5  PLT 295 158    CBG: No results for input(s): GLUCAP in the last 168 hours.  Recent Results (from the past 240 hour(s))  Blood Culture (routine x 2)     Status: None (Preliminary result)   Collection Time: 01/12/15  1:11 AM  Result Value Ref Range Status   Specimen Description RIGHT ANTECUBITAL  Final   Special Requests BOTTLES DRAWN AEROBIC AND ANAEROBIC 6CC  Final   Culture PENDING  Incomplete   Report Status PENDING  Incomplete  Blood Culture (routine x 2)  Status: None (Preliminary result)   Collection Time: 01/12/15  1:17 AM  Result Value Ref Range Status   Specimen Description BLOOD RIGHT HAND  Final   Special Requests BOTTLES DRAWN AEROBIC AND ANAEROBIC [facility]6CC  Final   Culture PENDING  Incomplete   Report Status PENDING  Incomplete      Studies/Results: Dg Chest Port 1 View  01/12/2015  CLINICAL DATA:  Fever, possible kidney stone. EXAM: PORTABLE CHEST 1 VIEW COMPARISON:  None. FINDINGS: Cardiomediastinal silhouette is unremarkable for this low inspiratory portable examination with crowded vasculature markings. The lungs are clear without pleural effusions or focal consolidations. Trachea projects midline and there is no pneumothorax. Included soft  tissue planes and osseous structures are non-suspicious. Tiny calcification projecting superior to the LEFT humeral head can be seen with calcific tendinopathy. IMPRESSION: No active disease. Electronically Signed   By: Awilda Metroourtnay  Bloomer M.D.   On: 01/12/2015 02:51    Medications:  Scheduled: . cefTRIAXone (ROCEPHIN)  IV  1 g Intravenous Q24H   Continuous: . 0.9 % NaCl with KCl 20 mEq / L 75 mL/hr at 01/12/15 0546   ZHY:QMVHPRN:alum & mag hydroxide-simeth, HYDROcodone-acetaminophen, ondansetron **OR** ondansetron (ZOFRAN) IV  Assessment/Plan:  Principal Problem:   Urinary tract obstruction due to kidney stone Active Problems:   AKI (acute kidney injury) (HCC)    Nephrolithiasis with obstructive uropathy Patient seen by urology overnight and underwent stent placement. Patient is feeling much better. She was also placed on ceftriaxone. Urine and blood cultures are pending. Pain medications as needed.  Acute kidney injury and hypokalemia Patient was given IV fluids and potassium. Repeat labs later today and tomorrow.  Mildly elevated bilirubin Recheck tomorrow  Normocytic anemia Some drop in hemoglobin due to dilution. Continue to monitor for now. Anemia panel in the morning.  DVT Prophylaxis: SCDs    Code Status: Full code  Family Communication: Discussed with the patient  Disposition Plan: Continue current management. Mobilize.      [facility] LL[facility]RISHNAN,Ameriah Lint  [facility] Pager [phone] 01/12/2015, 8:43 AM  If 7PM-7AM, please contact night-coverage at [email or web address], password [email or web address]

## 2015-01-12 NOTE — Progress Notes (Signed)
ANTIBIOTIC CONSULT NOTE - INITIAL  Pharmacy Consult for Zosyn Indication: sepsis  No Known Allergies  Patient Measurements: Height: 5\' 6"  (167.6 cm) Weight: 226 lb 1.6 oz (102.558 kg) IBW/kg (Calculated) : 59.3  Vital Signs: Temp: 99.9 F (37.7 C) (12/04 1630) Temp Source: Oral (12/04 1630) BP: 123/70 mmHg (12/04 1439) Pulse Rate: 90 (12/04 1439) Intake/Output from previous day: 12/03 0701 - 12/04 0700 In: 600 [I.V.:600] Out: 0  Intake/Output from this shift:    Labs:  Recent Labs  01/12/15 0155 01/12/15 1151  WBC 8.8  --   HGB 10.5*  --   PLT 158  --   CREATININE 1.36* 1.23*   Estimated Creatinine Clearance: 64 mL/min (by C-G formula based on Cr of 1.23). No results for input(s): VANCOTROUGH, VANCOPEAK, VANCORANDOM, GENTTROUGH, GENTPEAK, GENTRANDOM, TOBRATROUGH, TOBRAPEAK, TOBRARND, AMIKACINPEAK, AMIKACINTROU, AMIKACIN in the last 72 hours.   Microbiology: Recent Results (from the past 720 hour(s))  Blood Culture (routine x 2)     Status: None (Preliminary result)   Collection Time: 01/12/15  1:11 AM  Result Value Ref Range Status   Specimen Description RIGHT ANTECUBITAL  Final   Special Requests BOTTLES DRAWN AEROBIC AND ANAEROBIC 6CC  Final   Culture  Setup Time   Final    GRAM NEGATIVE RODS CRITICAL RESULT CALLED TO, READ BACK BY AND VERIFIED WITH: MAYS, J. AT 1914 ON 01/12/2015 BY AGUNDIZ, E. GS DONE @ [facility]    Culture PENDING  Incomplete   Report Status PENDING  Incomplete  Blood Culture (routine x 2)     Status: None (Preliminary result)   Collection Time: 01/12/15  1:17 AM  Result Value Ref Range Status   Specimen Description BLOOD RIGHT HAND  Final   Special Requests BOTTLES DRAWN AEROBIC AND ANAEROBIC 6CC  Final   Culture PENDING  Incomplete   Report Status PENDING  Incomplete    Medical History: Past Medical History  Diagnosis Date  . Renal disorder     kidney stones   Assessment: Estimated Creatinine Clearance: 64 mL/min (by C-G formula  based on Cr of 1.23).   Goal of Therapy: Eradicate infection.  Plan:  Zosyn 3.375gm IV q8hr Monitor cultures and progress  Valrie HartHall, Ademide Schaberg A 01/12/2015,8:43 PM

## 2015-01-13 DIAGNOSIS — R7881 Bacteremia: Secondary | ICD-10-CM

## 2015-01-13 LAB — CBC
HEMATOCRIT: 32.9 % — AB (ref 36.0–46.0)
HEMOGLOBIN: 10.6 g/dL — AB (ref 12.0–15.0)
MCH: 28.4 pg (ref 26.0–34.0)
MCHC: 32.2 g/dL (ref 30.0–36.0)
MCV: 88.2 fL (ref 78.0–100.0)
Platelets: 207 10*3/uL (ref 150–400)
RBC: 3.73 MIL/uL — ABNORMAL LOW (ref 3.87–5.11)
RDW: 13.4 % (ref 11.5–15.5)
WBC: 10.5 10*3/uL (ref 4.0–10.5)

## 2015-01-13 LAB — COMPREHENSIVE METABOLIC PANEL
ALBUMIN: 2.8 g/dL — AB (ref 3.5–5.0)
ALK PHOS: 112 U/L (ref 38–126)
ALT: 26 U/L (ref 14–54)
ANION GAP: 7 (ref 5–15)
AST: 17 U/L (ref 15–41)
BILIRUBIN TOTAL: 0.8 mg/dL (ref 0.3–1.2)
BUN: 14 mg/dL (ref 6–20)
CO2: 25 mmol/L (ref 22–32)
CREATININE: 1.27 mg/dL — AB (ref 0.44–1.00)
Calcium: 8 mg/dL — ABNORMAL LOW (ref 8.9–10.3)
Chloride: 105 mmol/L (ref 101–111)
GFR, EST AFRICAN AMERICAN: 55 mL/min — AB (ref >60)
GFR, EST NON AFRICAN AMERICAN: 47 mL/min — AB (ref >60)
GLUCOSE: 106 mg/dL — AB (ref 65–99)
POTASSIUM: 3.7 mmol/L (ref 3.5–5.1)
Sodium: 137 mmol/L (ref 135–145)
Total Protein: 6.5 g/dL (ref 6.5–8.1)

## 2015-01-13 LAB — IRON AND TIBC
IRON: 13 ug/dL — AB (ref 28–170)
Saturation Ratios: 5 % — ABNORMAL LOW (ref 10.4–31.8)
TIBC: 270 ug/dL (ref 250–450)
UIBC: 257 ug/dL

## 2015-01-13 LAB — VITAMIN B12: VITAMIN B 12: 925 pg/mL — AB (ref 180–914)

## 2015-01-13 LAB — URINE CULTURE: Culture: 1000

## 2015-01-13 LAB — FOLATE: Folate: 14.4 ng/mL (ref >5.9)

## 2015-01-13 LAB — RETICULOCYTES
RBC.: 3.73 MIL/uL — ABNORMAL LOW (ref 3.87–5.11)
RETIC COUNT ABSOLUTE: 18.7 10*3/uL — AB (ref 19.0–186.0)
RETIC CT PCT: 0.5 % (ref 0.4–3.1)

## 2015-01-13 LAB — FERRITIN: Ferritin: 147 ng/mL (ref 11–307)

## 2015-01-13 MED ORDER — POTASSIUM CHLORIDE IN NACL 20-0.9 MEQ/L-% IV SOLN
INTRAVENOUS | Status: DC
  Administered 2015-01-13: 16:00:00 via INTRAVENOUS

## 2015-01-13 NOTE — Progress Notes (Addendum)
CRITICAL VALUE ALERT  Critical value received: + BC Anaerobic Gram neg rods   Date of notification:  01/13/15  Time of notification:  0700  Critical value read back:Yes.    Nurse who received alert:  Richardean ChimeraMarion Jezlyn Westerfield  MD notified (1st page):  Dr Kerry HoughMemon  Time of first page:  [phone]  MD notified (2nd page):  Time of second page:  Responding MD:    Time MD responded:

## 2015-01-13 NOTE — Progress Notes (Addendum)
[facility] PROGRESS NOTE  Carolyn HakeWanda Standiford ZOX:[id number] DOB: [birth year] DOA: 01/11/2015  PCP: No primary care provider on file.  Brief HPI: 54 year old Caucasian female with a past medical history of nephrolithiasis who presented to the emergency department with the flank pain a few days ago. She was diagnosed as having stones in the right ureter. Patient was treated symptomatically and was discharged from the emergency department. However, she presented again due to persistent pain as well as fever of 104F. She was seen by urology and underwent a stent placement and was hospitalized for further management.  Past medical history:  Past Medical History  Diagnosis Date  . Renal disorder     kidney stones    Consultants: Urology  Procedures: Cystoscopy, right retrograde ureteropyelogram, right double-J stent placement (6 [place] by 24 cm) 12/4  Antibiotics: Ceftriaxone was changed to Zosyn 12/5  Subjective: Patient and he needs to feel well. Denies any complaints. No nausea, vomiting. Denies any pain.   Objective: Vital Signs  Filed Vitals:   01/12/15 1439 01/12/15 1630 01/12/15 2106 01/13/15 0613  BP: 123/70  131/80 133/81  Pulse: 90  94 95  Temp: 98.7 F (37.1 C) 99.9 F (37.7 C) 99.3 F (37.4 C) 99 F (37.2 C)  TempSrc: Oral Oral Oral Oral  Resp: 20  20 20   Height:    5\' 6"  (1.676 m)  Weight:    91.672 kg (202 lb 1.6 oz)  SpO2: 95%  96% 95%    Intake/Output Summary (Last 24 hours) at 01/13/15 0918 Last data filed at 01/13/15 0911  Gross per 24 hour  Intake    600 ml  Output      0 ml  Net    600 ml   Filed Weights   01/11/15 2356 01/12/15 0450 01/13/15 0613  Weight: 86.183 kg (190 lb) 102.558 kg (226 lb 1.6 oz) 91.672 kg (202 lb 1.6 oz)    General appearance: alert, cooperative, appears stated age, no distress and moderately obese Resp: clear to auscultation bilaterally Cardio: regular rate and rhythm, S1, S2 normal, no murmur, click, rub or  gallop GI: soft, non-tender; bowel sounds normal; no masses,  no organomegaly Neurologic: Alert and oriented 3. No focal neurological deficits are noted.  Lab Results:  Basic Metabolic Panel:  Recent Labs Lab 01/07/15 1101 01/12/15 0155 01/12/15 1151 01/13/15 0633  NA 137 134* 137 137  K 3.8 3.0* 3.7 3.7  CL 103 99* 103 105  CO2 25 23 24 25   GLUCOSE 141* 103* 116* 106*  BUN 12 21* 17 14  CREATININE 0.88 1.36* 1.23* 1.27*  CALCIUM 9.3 8.6* 8.2* 8.0*   Liver Function Tests:  Recent Labs Lab 01/07/15 1101 01/12/15 0155 01/13/15 0633  AST 19 28 17   ALT 18 36 26  ALKPHOS 42 134* 112  BILITOT 0.8 1.8* 0.8  PROT 7.8 6.2* 6.5  ALBUMIN 4.5 2.8* 2.8*    Recent Labs Lab 01/07/15 1101  LIPASE 30   CBC:  Recent Labs Lab 01/07/15 1101 01/12/15 0155 01/13/15 0633  WBC 15.9* 8.8 10.5  NEUTROABS  --  7.9*  --   HGB 13.9 10.5* 10.6*  HCT 40.7 30.6* 32.9*  MCV 87.2 85.5 88.2  PLT 295 158 207    CBG: No results for input(s): GLUCAP in the last 168 hours.  Recent Results (from the past 240 hour(s))  Blood Culture (routine x 2)     Status: None (Preliminary result)   Collection Time: 01/12/15  1:11 AM  Result Value Ref Range Status   Specimen Description RIGHT ANTECUBITAL  Final   Special Requests BOTTLES DRAWN AEROBIC AND ANAEROBIC 6CC  Final   Culture  Setup Time   Final    GRAM NEGATIVE RODS CRITICAL RESULT CALLED TO, READ BACK BY AND VERIFIED WITH: MAYS, J. AT 1914 ON 01/12/2015 BY AGUNDIZ, E. GS DONE @ [facility]    Culture PENDING  Incomplete   Report Status PENDING  Incomplete  Blood Culture (routine x 2)     Status: None (Preliminary result)   Collection Time: 01/12/15  1:17 AM  Result Value Ref Range Status   Specimen Description BLOOD RIGHT HAND  Final   Special Requests BOTTLES DRAWN AEROBIC AND ANAEROBIC [facility]  Final   Culture PENDING  Incomplete   Report Status PENDING  Incomplete      Studies/Results: Dg Chest Port 1 View  01/12/2015  CLINICAL  DATA:  Fever, possible kidney stone. EXAM: PORTABLE CHEST 1 VIEW COMPARISON:  None. FINDINGS: Cardiomediastinal silhouette is unremarkable for this low inspiratory portable examination with crowded vasculature markings. The lungs are clear without pleural effusions or focal consolidations. Trachea projects midline and there is no pneumothorax. Included soft tissue planes and osseous structures are non-suspicious. Tiny calcification projecting superior to the LEFT humeral head can be seen with calcific tendinopathy. IMPRESSION: No active disease. Electronically Signed   By: Awilda Metro M.D.   On: 01/12/2015 02:51    Medications:  Scheduled: . piperacillin-tazobactam (ZOSYN)  IV  3.375 g Intravenous Q8H   Continuous: . 0.9 % NaCl with KCl 20 mEq / L 75 mL/hr at 01/12/15 2239   CZY:SAYT & mag hydroxide-simeth, HYDROcodone-acetaminophen, ondansetron **OR** ondansetron (ZOFRAN) IV  Assessment/Plan:  Principal Problem:   Urinary tract obstruction due to kidney stone Active Problems:   AKI (acute kidney injury) (HCC)    Nephrolithiasis with obstructive uropathy/Fever Patient seen by urology and underwent ureteral stent placement. Patient is feeling much better. She was also placed on ceftriaxone. Blood cultures are growing gram-negative bacteria. She was changed over to Zosyn overnight.  Gram-negative Bacteremia Most likely from urological source. Urine cultures are pending. Continue Zosyn for now.  Acute kidney injury likely due to obstructive uropathy/ hypokalemia Patient was given IV fluids and potassium. Renal function has improved. Potassium level is improved. Continue to monitor. Continue IV fluids for now.  Mildly elevated bilirubin Bilirubin is normal today.  Normocytic anemia Some drop in hemoglobin due to dilution. Continue to monitor for now. Anemia panel is pending. Hemoglobin is stable.  DVT Prophylaxis: SCDs    Code Status: Full code  Family Communication: Discussed  with the patient  Disposition Plan: Continue to mobilize. Await final culture identification and sensitivities. She will need follow-up with urology.    LOS: 1 day   [facility]  [facility] Pager [phone] 01/13/2015, 9:18 AM  If 7PM-7AM, please contact night-coverage at [email or web address], password [facility]

## 2015-01-13 NOTE — Clinical Documentation Improvement (Signed)
Internal Medicine  Please clarify if sepsis has been ruled in or out.    Sepsis ruled in  Sepsis ruled out  Other  Clinically Undetermined  Supporting Information:  ED provider note states "sepsis, due to unspecified organism".  H&P states "likely early sepsis / bacteremia."  ED provider note also states max temp prior to arrival 105.  Tachycardia of 132 on arrival.  Op note states "I then noted pus coming out of distal end of the stent."     Please exercise your independent, professional judgment when responding. A specific answer is not anticipated or expected. Please update your documentation within the medical record to reflect your response to this query.  Thank you, Doy MinceVangela Airlie Blumenberg, RN [phone] Clinical Documentation Specialist

## 2015-01-13 NOTE — Clinical Documentation Improvement (Signed)
  Internal Medicine  Can the diagnosis of acute kidney injury be further specified?   Acute Tubular Necrosis  Acute Renal Cortical Necrosis  Acute Renal Medullary Necrosis  Other  Clinically Undetermined  Document any associated diagnoses/conditions.  Supporting Information: Component      BUN Creatinine  Latest Ref Rng      6 - 20 mg/dL 0.860.44 - 5.781.00 mg/dL  46/9/629512/05/2014      2:841:55 AM 21 (H) 1.36 (H)  01/12/2015     11:51 AM 17 1.23 (H)  01/13/2015      14 1.27 (H)    Please exercise your independent, professional judgment when responding. A specific answer is not anticipated or expected.Please update your documentation within the medical record to reflect your response to this query.  Thank you, Doy MinceVangela Charmin Aguiniga, RN [phone] Clinical Documentation Specialist

## 2015-01-13 NOTE — Care Management Note (Signed)
Case Management Note  Patient Details  Name: Carolyn Patrick MRN: [id number] Date of Birth: [birth year]  Subjective/Objective:                  Pt admitted from home with UTI and ureteral obs. Pt lives with her husband and will return home at discharge. Pt is independent with ADL's.  Action/Plan: No CM needs noted.  Expected Discharge Date:  01/13/15               Expected Discharge Plan:  Home/Self Care  In-House Referral:  NA  Discharge planning Services  CM Consult  Post Acute Care Choice:  NA Choice offered to:  NA  DME Arranged:    DME Agency:     HH Arranged:    HH Agency:     Status of Service:  Completed, signed off  Medicare Important Message Given:    Date Medicare IM Given:    Medicare IM give by:    Date Additional Medicare IM Given:    Additional Medicare Important Message give by:     If discussed at Long Length of Stay Meetings, dates discussed:    Additional Comments:  Cheryl FlashBlackwell, Heaven Meeker Crowder, RN 01/13/2015, 11:18 AM

## 2015-01-14 ENCOUNTER — Encounter (HOSPITAL_COMMUNITY): Payer: Self-pay | Admitting: Urology

## 2015-01-14 DIAGNOSIS — D649 Anemia, unspecified: Secondary | ICD-10-CM

## 2015-01-14 DIAGNOSIS — N201 Calculus of ureter: Secondary | ICD-10-CM

## 2015-01-14 LAB — BASIC METABOLIC PANEL
ANION GAP: 6 (ref 5–15)
BUN: 11 mg/dL (ref 6–20)
CALCIUM: 8.2 mg/dL — AB (ref 8.9–10.3)
CHLORIDE: 105 mmol/L (ref 101–111)
CO2: 26 mmol/L (ref 22–32)
CREATININE: 1.26 mg/dL — AB (ref 0.44–1.00)
GFR calc non Af Amer: 48 mL/min — ABNORMAL LOW (ref >60)
GFR, EST AFRICAN AMERICAN: 55 mL/min — AB (ref >60)
Glucose, Bld: 101 mg/dL — ABNORMAL HIGH (ref 65–99)
Potassium: 3.8 mmol/L (ref 3.5–5.1)
SODIUM: 137 mmol/L (ref 135–145)

## 2015-01-14 LAB — CBC
HEMATOCRIT: 30.3 % — AB (ref 36.0–46.0)
HEMOGLOBIN: 10 g/dL — AB (ref 12.0–15.0)
MCH: 29 pg (ref 26.0–34.0)
MCHC: 33 g/dL (ref 30.0–36.0)
MCV: 87.8 fL (ref 78.0–100.0)
Platelets: 248 10*3/uL (ref 150–400)
RBC: 3.45 MIL/uL — ABNORMAL LOW (ref 3.87–5.11)
RDW: 13.3 % (ref 11.5–15.5)
WBC: 10.5 10*3/uL (ref 4.0–10.5)

## 2015-01-14 NOTE — Progress Notes (Signed)
[facility] PROGRESS NOTE  Carolyn Patrick ZOX:[id number] DOB: Sep 08, 1960 DOA: 01/11/2015  PCP: No primary care provider on file.  Brief HPI: 54 year old Caucasian female with a past medical history of nephrolithiasis who presented to the emergency department with the flank pain a few days ago. She was diagnosed as having stones in the right ureter. Patient was treated symptomatically and was discharged from the emergency department. However, she presented again due to persistent pain as well as fever of 104F. She was seen by urology and underwent a stent placement and was hospitalized for further management. She subsequently was noted to have bacteremia. Final identification and sensitivities pending.  Past medical history:  Past Medical History  Diagnosis Date  . Renal disorder     kidney stones    Consultants: Urology  Procedures: Cystoscopy, right retrograde ureteropyelogram, right double-J stent placement (6 [place] by 24 cm) 12/4  Antibiotics: Ceftriaxone was changed to Zosyn 12/5  Subjective: Patient feels well. Denies any complaints. No nausea or vomiting.   Objective: Vital Signs  Filed Vitals:   01/12/15 2106 01/13/15 0613 01/13/15 1425 01/14/15 0617  BP: 131/80 133/81 133/73 136/85  Pulse: 94 95 94 87  Temp: 99.3 F (37.4 C) 99 F (37.2 C) 99.5 F (37.5 C) 99.5 F (37.5 C)  TempSrc: Oral Oral Oral Oral  Resp: Height:   (1.676 m)    Weight:  91.672 kg (202 lb 1.6 oz)  90.8 kg (200 lb 2.8 oz)  SpO2: 96% 95% 95% 96%    Intake/Output Summary (Last 24 hours) at 01/14/15 0942 Last data filed at 01/14/15 0448  Gross per 24 hour  Intake    460 ml  Output      4 ml  Net    456 ml   Filed Weights   01/12/15 0450 01/13/15 0613 01/14/15 0617  Weight: 102.558 kg (226 lb 1.6 oz) 91.672 kg (202 lb 1.6 oz) 90.8 kg (200 lb 2.8 oz)    General appearance: alert, cooperative, appears stated age, no distress and moderately obese Resp: clear to  auscultation bilaterally Cardio: regular rate and rhythm, S1, S2 normal, no murmur, click, rub or gallop GI: soft, non-tender; bowel sounds normal; no masses,  no organomegaly Neurologic: Alert and oriented 3. No focal neurological deficits are noted.  Lab Results:  Basic Metabolic Panel:  Recent Labs Lab 01/07/15 1101 01/12/15 0155 01/12/15 1151 01/13/15 0633 01/14/15 0555  NA 137 134* 137 137 137  K 3.8 3.0* 3.7 3.7 3.8  CL 103 99* 103 105 105  CO2 GLUCOSE 141* 103* 116* 106* 101*  BUN 12 21* CREATININE 0.88 1.36* 1.23* 1.27* 1.26*  CALCIUM 9.3 8.6* 8.2* 8.0* 8.2*   Liver Function Tests:  Recent Labs Lab 01/07/15 1101 01/12/15 0155 01/13/15 0633  AST ALT 18 36 26  ALKPHOS 42 134* 112  BILITOT 0.8 1.8* 0.8  PROT 7.8 6.2* 6.5  ALBUMIN 4.5 2.8* 2.8*    Recent Labs Lab 01/07/15 1101  LIPASE 30   CBC:  Recent Labs Lab 01/07/15 1101 01/12/15 0155 01/13/15 0633 01/14/15 0555  WBC 15.9* 8.8 10.5 10.5  NEUTROABS  --  7.9*  --   --   HGB 13.9 10.5* 10.6* 10.0*  HCT 40.7 30.6* 32.9* 30.3*  MCV 87.2 85.5 88.2 87.8  PLT 295 158 207 248    CBG: No results for input(s): GLUCAP in the last  168 hours.  Recent Results (from the past 240 hour(s))  Urine culture     Status: None   Collection Time: 01/12/15 12:11 AM  Result Value Ref Range Status   Specimen Description URINE, CLEAN CATCH  Final   Special Requests NONE  Final   Culture   Final    1,000 COLONIES/mL INSIGNIFICANT GROWTH [facility]    Report Status 01/13/2015 FINAL  Final  Blood Culture (routine x 2)     Status: None (Preliminary result)   Collection Time: 01/12/15  1:11 AM  Result Value Ref Range Status   Specimen Description RIGHT ANTECUBITAL  Final   Special Requests BOTTLES DRAWN AEROBIC AND ANAEROBIC 6CC  Final   Culture  Setup Time   Final    GRAM NEGATIVE RODS Gram Stain Report Called to,Read Back By and Verified With: MAYS, J.  AT 1914 ON 01/12/2015 BY AGUNDIZ, E. Performed at [facility]    Culture PENDING  Incomplete   Report Status PENDING  Incomplete  Blood Culture (routine x 2)     Status: None (Preliminary result)   Collection Time: 01/12/15  1:17 AM  Result Value Ref Range Status   Specimen Description BLOOD RIGHT HAND  Final   Special Requests BOTTLES DRAWN AEROBIC AND ANAEROBIC 6CC  Final   Culture NO GROWTH 1 DAY  Final   Report Status PENDING  Incomplete      Studies/Results: No results found.  Medications:  Scheduled: . piperacillin-tazobactam (ZOSYN)  IV  3.375 g Intravenous Q8H   Continuous:   ZOX:WRUEPRN:alum & mag hydroxide-simeth, HYDROcodone-acetaminophen, ondansetron **OR** ondansetron (ZOFRAN) IV  Assessment/Plan:  Principal Problem:   Urinary tract obstruction due to kidney stone Active Problems:   AKI (acute kidney injury) (HCC)    Nephrolithiasis with obstructive uropathy/Fever Patient seen by urology and underwent ureteral stent placement. Patient is feeling much better. Patient was initially placed on ceftriaxone, which was changed over to Zosyn due to bacteremia. Blood culture reports are still in process. It looks like the bacteria is E coli. Sensitivities are pending. She will need follow-up with urology (Dr. Lynnae Sandhoffahlsted) for stent removal.  E coli Bacteremia Most likely from urological source. Sensitivities pending. Urine cultures did not show any growth. Continue Zosyn for now. Anticipate change to oral antibiotics once sensitivities are available.  Acute kidney injury likely due to obstructive uropathy/ hypokalemia Patient was given IV fluids and potassium. Renal function has improved. Potassium level is improved. Continue to monitor. Can stop IV fluids.  Mildly elevated bilirubin at admission Bilirubin was normal subsequently.  Normocytic anemia Some drop in hemoglobin due to dilution. Continue to monitor for now. Anemia panel reviewed. Hemoglobin is  stable.  DVT Prophylaxis: SCDs    Code Status: Full code  Family Communication: Discussed with the patient  Disposition Plan: Continue to mobilize. Await final sensitivities. Hopefully will be able to change her to oral antibiotics later today and tomorrow and then discharge soon afterwards. She will need follow-up with urology.    LOS: 2 days   [facility] Center[facility],Clarkson Rosselli  [facility] Pager [phone] 01/14/2015, 9:42 AM  If 7PM-7AM, please contact night-coverage at [email or web address], password [email or web address]

## 2015-01-14 NOTE — Progress Notes (Signed)
2 Days Post-Op Subjective: Patient reports that she's feeling fine. Minimal urinary sx's  Objective: Vital signs in last 24 hours: Temp:  [99.5 F (37.5 C)] 99.5 F (37.5 C) (12/06 0617) Pulse Rate:  [87-94] 87 (12/06 0617) Resp:  [20] 20 (12/06 0617) BP: (133-136)/(73-85) 136/85 mmHg (12/06 0617) SpO2:  [95 %-96 %] 96 % (12/06 0617) Weight:  [90.8 kg (200 lb 2.8 oz)] 90.8 kg (200 lb 2.8 oz) (12/06 0617)  Intake/Output from previous day: 12/05 0701 - 12/06 0700 In: 580 [P.O.:480; IV Piggyback:100] Out: 4 [Urine:4] Intake/Output this shift: Total I/O In: 240 [P.O.:240] Out: -   Physical Exam:  Constitutional: Vital signs reviewed. WD WN in NAD   Eyes: PERRL, No scleral icterus.   Pulmonary/Chest: Normal effort   Lab Results:  Recent Labs  01/12/15 0155 01/13/15 0633 01/14/15 0555  HGB 10.5* 10.6* 10.0*  HCT 30.6* 32.9* 30.3*   BMET  Recent Labs  01/13/15 0633 01/14/15 0555  NA 137 137  K 3.7 3.8  CL 105 105  CO2 25 26  GLUCOSE 106* 101*  BUN 14 11  CREATININE 1.27* 1.26*  CALCIUM 8.0* 8.2*   No results for input(s): LABPT, INR in the last 72 hours. No results for input(s): LABURIN in the last 72 hours. Results for orders placed or performed during the hospital encounter of 01/11/15  Urine culture     Status: None   Collection Time: 01/12/15 12:11 AM  Result Value Ref Range Status   Specimen Description URINE, CLEAN CATCH  Final   Special Requests NONE  Final   Culture   Final    1,000 COLONIES/mL INSIGNIFICANT GROWTH Performed at [facility]    Report Status 01/13/2015 FINAL  Final  Blood Culture (routine x 2)     Status: None (Preliminary result)   Collection Time: 01/12/15  1:11 AM  Result Value Ref Range Status   Specimen Description BLOOD RIGHT ANTECUBITAL DRAWN BY RN  Final   Special Requests BOTTLES DRAWN AEROBIC AND ANAEROBIC 6CC  Final   Culture  Setup Time   Final    GRAM NEGATIVE RODS Gram Stain Report Called to,Read Back By  and Verified With: MAYS, J. AT 1914 ON 01/12/2015 BY AGUNDIZ, E. Performed at [facility] IN BOTH AEROBIC AND ANAEROBIC BOTTLES    Culture   Final    ESCHERICHIA COLI Performed at [facility]    Report Status PENDING  Incomplete  Blood Culture (routine x 2)     Status: None (Preliminary result)   Collection Time: 01/12/15  1:17 AM  Result Value Ref Range Status   Specimen Description BLOOD RIGHT HAND  Final   Special Requests BOTTLES DRAWN AEROBIC AND ANAEROBIC 6CC  Final   Culture NO GROWTH 1 DAY  Final   Report Status PENDING  Incomplete    Studies/Results: No results found.  Assessment/Plan:   Infected stone w/ sepsis, 1/2 blood cultures growing GNRs--seems to be doing well with currrent abx regimen/stent  We will call pt to set up outpt f/u to eventually set up stone extraction   LOS: 2 days   Marcine MatarDAHLSTEDT, Ricardo Schubach M 01/14/2015, 1:21 PM

## 2015-01-15 DIAGNOSIS — N200 Calculus of kidney: Secondary | ICD-10-CM

## 2015-01-15 DIAGNOSIS — N139 Obstructive and reflux uropathy, unspecified: Secondary | ICD-10-CM

## 2015-01-15 LAB — CULTURE, BLOOD (ROUTINE X 2)

## 2015-01-15 MED ORDER — CIPROFLOXACIN HCL 500 MG PO TABS: 500.0000 mg | ORAL_TABLET | Freq: Two times a day (BID) | ORAL | Status: DC

## 2015-01-15 NOTE — Discharge Summary (Signed)
Physician Discharge Summary  Clifford Coudriet ZOX:[id number] DOB: 1960-02-27 DOA: 01/11/2015  PCP: No primary care provider on file.  Admit date: 01/11/2015 Discharge date: 01/15/2015  Time spent: > 35 minutes  Recommendations for Outpatient Follow-up:  1. Consider reassessing cbc 2. Patient will need 14 days total of antibiotic therapy  Discharge Diagnoses:  Principal Problem:   Urinary tract obstruction due to kidney stone Active Problems:   AKI (acute kidney injury) [facility])   Discharge Condition: Stable  Diet recommendation: Heart healthy  Filed Weights   01/13/15 0613 01/14/15 0617 01/15/15 0640  Weight: 91.672 kg (202 lb 1.6 oz) 90.8 kg (200 lb 2.8 oz) 89.585 kg (197 lb 8 oz)    History of present illness:  From original HPI:  54 yo female h/o kidney stones was diagnosed with a stone couple of days ago, thought it would pass. On Saturday she started having high fever over 104 with associated chills, and feeling awful. She still had the right sided abd pain with right flank pain. No n/v. Found to have obstructing stone on the right with mild acute kidney injury. Urology came in and placed stent right ureter.   Hospital Course:  Infected stone w/ sepsis - Patient had urology place stent at right ureter - Plan is for patient to follow up with Urology - Serum creatinine stable  Sepsis - 1/2 bottles growing pan sensitive E. Coli - Will discharge on 11 days of Cipro to complete a 14 day treatment course  Procedures:  None  Consultations:  Urology  Discharge Exam: Filed Vitals:   01/14/15 2205 01/15/15 0640  BP: 159/82 154/78  Pulse: 81 83  Temp: 99.2 F (37.3 C) 98.6 F (37 C)  Resp: 20 20    General: Pt in nad, alert and awake Cardiovascular: rrr, no mrg Respiratory: cta bl, no wheezes  Discharge Instructions   Discharge Instructions    Call MD for:  redness, tenderness, or signs of infection (pain, swelling, redness, odor or green/yellow discharge  around incision site)    Complete by:  As directed      Call MD for:  severe uncontrolled pain    Complete by:  As directed      Call MD for:  temperature >100.4    Complete by:  As directed      Diet - low sodium heart healthy    Complete by:  As directed      Discharge instructions    Complete by:  As directed   Please follow-up with your urologist. Also you'll need to complete your antibiotic course. Follow-up with your primary care physician to continue to have any fevers chills or any other new medical concerns.     Increase activity slowly    Complete by:  As directed           Current Discharge Medication List    START taking these medications   Details  ciprofloxacin (CIPRO) 500 MG tablet Take 1 tablet (500 mg total) by mouth 2 (two) times daily. Qty: 22 tablet, Refills: 0      CONTINUE these medications which have NOT CHANGED   Details  bisacodyl (DULCOLAX) 5 MG EC tablet Take 5 mg by mouth daily as needed for mild constipation.    ondansetron (ZOFRAN ODT) 4 MG disintegrating tablet Take 1 tablet (4 mg total) by mouth every 8 (eight) hours as needed for nausea or vomiting. Qty: 20 tablet, Refills: 0    oxyCODONE-acetaminophen (PERCOCET/ROXICET) 5-325 MG tablet Take 2 tablets  by mouth every 4 (four) hours as needed for severe pain. Qty: 20 tablet, Refills: 0      STOP taking these medications     tamsulosin (FLOMAX) 0.4 MG CAPS capsule        No Known Allergies Follow-up Information    Follow up with Chelsea Aus, MD On 02/04/2015.   Specialty:  Urology   Why:  at 1:45 pm   Contact information:   [address] [place] [address] [phone]        The results of significant diagnostics from this hospitalization (including imaging, microbiology, ancillary and laboratory) are listed below for reference.    Significant Diagnostic Studies: Dg Chest Port 1 View  01/12/2015  CLINICAL DATA:  Fever, possible kidney stone. EXAM: PORTABLE  CHEST 1 VIEW COMPARISON:  None. FINDINGS: Cardiomediastinal silhouette is unremarkable for this low inspiratory portable examination with crowded vasculature markings. The lungs are clear without pleural effusions or focal consolidations. Trachea projects midline and there is no pneumothorax. Included soft tissue planes and osseous structures are non-suspicious. Tiny calcification projecting superior to the LEFT humeral head can be seen with calcific tendinopathy. IMPRESSION: No active disease. Electronically Signed   By: Awilda Metro M.D.   On: 01/12/2015 02:51   Dg C-arm 1-60 Min-no Report  01/13/2015  CLINICAL DATA: stones C-ARM 1-60 MINUTES Fluoroscopy was utilized by the requesting physician.  No radiographic interpretation.   Ct Renal Stone Study  01/07/2015  CLINICAL DATA:  54 year old female with history of right-sided flank pain since yesterday evening. Nausea and vomiting. EXAM: CT ABDOMEN AND PELVIS WITHOUT CONTRAST TECHNIQUE: Multidetector CT imaging of the abdomen and pelvis was performed following the standard protocol without IV contrast. COMPARISON:  No priors. FINDINGS: Lower chest:  Unremarkable. Hepatobiliary: No cystic or solid hepatic lesions are confidently identified on today's noncontrast CT examination. The unenhanced appearance of the gallbladder is normal. Pancreas: No definite pancreatic mass or peripancreatic inflammatory changes are identified on today's noncontrast CT examination. Spleen: Unremarkable. Adrenals/Urinary Tract: There appears to be 2 stones adjacent to one another in the distal third of the right ureter best appreciated on image 67 of series 2, largest of which measures 5 mm. This is associated with moderate proximal hydroureteronephrosis and extensive perinephric stranding, indicative of obstruction at this time. No additional calcifications are identified within the collecting system of the left kidney, along the course of the left ureter or within the lumen  of the urinary bladder. The unenhanced appearance of the kidneys and urinary bladder is otherwise unremarkable. Bilateral adrenal glands are normal in appearance. Stomach/Bowel: Normal appearance of the stomach. No pathologic dilatation of small bowel or colon. Normal appendix. Vascular/Lymphatic: Mild atherosclerotic calcifications identified throughout the abdominal and pelvic vasculature, without definite aneurysm. No lymphadenopathy noted in the abdomen or pelvis on today's noncontrast CT examination. Reproductive: Uterus and ovaries are unremarkable in appearance. Other: Trace volume of free fluid in the low anatomic pelvis, likely reactive to the right-sided urinary tract obstruction. No larger volume of ascites. No pneumoperitoneum. Musculoskeletal: There are no aggressive appearing lytic or blastic lesions noted in the visualized portions of the skeleton. IMPRESSION: 1. There are 2 calculi adjacent to one another in the distal third of the right ureter, largest of which measures 5 mm. These are obstructive associated with moderate proximal right hydroureteronephrosis and extensive perinephric stranding at this time. 2. Normal appendix. 3. Mild atherosclerosis. Electronically Signed   By: Trudie Reed M.D.   On: 01/07/2015 14:13  Microbiology: Recent Results (from the past 240 hour(s))  Urine culture     Status: None   Collection Time: 01/12/15 12:11 AM  Result Value Ref Range Status   Specimen Description URINE, CLEAN CATCH  Final   Special Requests NONE  Final   Culture   Final    1,000 COLONIES/mL INSIGNIFICANT GROWTH [facility]formed [facility]    Report Status 01/13/2015 FINAL  Final  Blood Culture (routine x 2)     Status: None   Collection Time: 01/12/15  1:11 AM  Result Value Ref Range Status   Specimen Description BLOOD RIGHT ANTECUBITAL DRAWN BY RN  Final   Special Requests BOTTLES DRAWN AEROBIC AND ANAEROBIC 6CC  Final   Culture  Setup Time   Final    GRAM NEGATIVE  RODS Gram Stain Report Called to,Read Back By and Verified With: MAYS, J. AT 1914 ON 01/12/2015 BY AGUNDIZ, E. Performed at [facility] IN BOTH AEROBIC AND ANAEROBIC BOTTLES    Culture   Final    ESCHERICHIA COLI Performed at [facility]    Report Status 01/15/2015 FINAL  Final   Organism ID, Bacteria ESCHERICHIA COLI  Final      Susceptibility   Escherichia coli - MIC*    AMPICILLIN 4 SENSITIVE Sensitive     CEFAZOLIN <=4 SENSITIVE Sensitive     CEFEPIME <=1 SENSITIVE Sensitive     CEFTAZIDIME <=1 SENSITIVE Sensitive     CEFTRIAXONE <=1 SENSITIVE Sensitive     CIPROFLOXACIN <=0.25 SENSITIVE Sensitive     GENTAMICIN <=1 SENSITIVE Sensitive     IMIPENEM <=0.25 SENSITIVE Sensitive     TRIMETH/SULFA <=20 SENSITIVE Sensitive     AMPICILLIN/SULBACTAM 4 SENSITIVE Sensitive     PIP/TAZO <=4 SENSITIVE Sensitive     * ESCHERICHIA COLI  Blood Culture (routine x 2)     Status: None (Preliminary result)   Collection Time: 01/12/15  1:17 AM  Result Value Ref Range Status   Specimen Description BLOOD RIGHT HAND  Final   Special Requests BOTTLES DRAWN AEROBIC AND ANAEROBIC 6CC  Final   Culture NO GROWTH 3 DAYS  Final   Report Status PENDING  Incomplete     Labs: Basic Metabolic Panel:  Recent Labs Lab 01/12/15 0155 01/12/15 1151 01/13/15 0633 01/14/15 0555  NA 134* 137 137 137  K 3.0* 3.7 3.7 3.8  CL 99* 103 105 105  CO2 23 24 25 26   GLUCOSE 103* 116* 106* 101*  BUN 21* 17 14 11   CREATININE 1.36* 1.23* 1.27* 1.26*  CALCIUM 8.6* 8.2* 8.0* 8.2*   Liver Function Tests:  Recent Labs Lab 01/12/15 0155 01/13/15 0633  AST 28 17  ALT 36 26  ALKPHOS 134* 112  BILITOT 1.8* 0.8  PROT 6.2* 6.5  ALBUMIN 2.8* 2.8*   No results for input(s): LIPASE, AMYLASE in the last 168 hours. No results for input(s): AMMONIA in the last 168 hours. CBC:  Recent Labs Lab 01/12/15 0155 01/13/15 0633 01/14/15 0555  WBC 8.8 10.5 10.5  NEUTROABS 7.9*  --   --   HGB 10.5*  10.6* 10.0*  HCT 30.6* 32.9* 30.3*  MCV 85.5 88.2 87.8  PLT 158 207 248   Cardiac Enzymes: No results for input(s): CKTOTAL, CKMB, CKMBINDEX, TROPONINI in the last 168 hours. BNP: BNP (last 3 results) No results for input(s): BNP in the last 8760 hours.  ProBNP (last 3 results) No results for input(s): PROBNP in the last 8760 hours.  CBG: No results  for input(s): GLUCAP in the last 168 hours.  Signed:  Penny Pia  [facility] 01/15/2015, 12:31 PM

## 2015-01-15 NOTE — Progress Notes (Signed)
Discharged home this afternoon,  with instructions given on medications,and follow up visits,patient verbalized understanding. Prescriptions sent with patient. Accompanied by staff to an awaiting vehicle.

## 2015-01-15 NOTE — Care Management Note (Signed)
Case Management Note  Patient Details  Name: Girtha HakeWanda Allbritton MRN: [id number] Date of Birth: 14-Nov-1960  Subjective/Objective:                    Action/Plan:   Expected Discharge Date:  01/13/15               Expected Discharge Plan:  Home/Self Care  In-House Referral:  NA  Discharge planning Services  CM Consult  Post Acute Care Choice:  NA Choice offered to:  NA  DME Arranged:    DME Agency:     HH Arranged:    HH Agency:     Status of Service:  Completed, signed off  Medicare Important Message Given:    Date Medicare IM Given:    Medicare IM give by:    Date Additional Medicare IM Given:    Additional Medicare Important Message give by:     If discussed at Long Length of Stay Meetings, dates discussed:    Additional Comments: Pt discharged home today. No CM needs noted. Arlyss QueenBlackwell, Aloria Looper Westervillerowder, RN 01/15/2015, 1:28 PM

## 2015-01-18 LAB — CULTURE, BLOOD (ROUTINE X 2): CULTURE: NO GROWTH

## 2015-02-04 ENCOUNTER — Ambulatory Visit: Admitting: Urology

## 2015-02-11 ENCOUNTER — Ambulatory Visit (INDEPENDENT_AMBULATORY_CARE_PROVIDER_SITE_OTHER): Admitting: Urology

## 2015-02-11 DIAGNOSIS — N201 Calculus of ureter: Secondary | ICD-10-CM

## 2015-02-11 DIAGNOSIS — N1330 Unspecified hydronephrosis: Secondary | ICD-10-CM

## 2015-02-11 DIAGNOSIS — N10 Acute tubulo-interstitial nephritis: Secondary | ICD-10-CM | POA: Diagnosis not present

## 2015-02-12 ENCOUNTER — Other Ambulatory Visit: Payer: Self-pay | Admitting: Urology

## 2015-02-13 NOTE — Patient Instructions (Signed)
Carolyn Patrick  02/13/2015     @PREFPERIOPPHARMACY @   Your procedure is scheduled on 02/18/2015.  Report to [facility] at 11:30 A.M.  Call this number if you have problems the morning of surgery:  [phone]   Remember:  Do not eat food or drink liquids after midnight.  Take these medicines the morning of surgery with A SIP OF WATER Zofran, oxycodone   Do not wear jewelry, make-up or nail polish.  Do not wear lotions, powders, or perfumes.  You may wear deodorant.  Do not shave 48 hours prior to surgery.  Men may shave face and neck.  Do not bring valuables to the hospital.  [facility] is not responsible for any belongings or valuables.  Contacts, dentures or bridgework may not be worn into surgery.  Leave your suitcase in the car.  After surgery it may be brought to your room.  For patients admitted to the hospital, discharge time will be determined by your treatment team.  Patients discharged the day of surgery will not be allowed to drive home.    Please read over the following fact sheets that you were given. Surgical Site Infection Prevention and Anesthesia Post-op Instructions     PATIENT INSTRUCTIONS POST-ANESTHESIA  IMMEDIATELY FOLLOWING SURGERY:  Do not drive or operate machinery for the first twenty four hours after surgery.  Do not make any important decisions for twenty four hours after surgery or while taking narcotic pain medications or sedatives.  If you develop intractable nausea and vomiting or a severe headache please notify your doctor immediately.  FOLLOW-UP:  Please make an appointment with your surgeon as instructed. You do not need to follow up with anesthesia unless specifically instructed to do so.  WOUND CARE INSTRUCTIONS (if applicable):  Keep a dry clean dressing on the anesthesia/puncture wound site if there is drainage.  Once the wound has quit draining you may leave it open to air.  Generally you should leave the bandage intact for twenty four  hours unless there is drainage.  If the epidural site drains for more than 36-48 hours please call the anesthesia department.  QUESTIONS?:  Please feel free to call your physician or the hospital operator if you have any questions, and they will be happy to assist you.      Cystoscopy Cystoscopy is a procedure that is used to help your caregiver diagnose and sometimes treat conditions that affect your lower urinary tract. Your lower urinary tract includes your bladder and the tube through which urine passes from your bladder out of your body (urethra). Cystoscopy is performed with a thin, tube-shaped instrument (cystoscope). The cystoscope has lenses and a light at the end so that your caregiver can see inside your bladder. The cystoscope is inserted at the entrance of your urethra. Your caregiver guides it through your urethra and into your bladder. There are two main types of cystoscopy:  Flexible cystoscopy (with a flexible cystoscope).  Rigid cystoscopy (with a rigid cystoscope). Cystoscopy may be recommended for many conditions, including:  Urinary tract infections.  Blood in your urine (hematuria).  Loss of bladder control (urinary incontinence) or overactive bladder.  Unusual cells found in a urine sample.  Urinary blockage.  Painful urination. Cystoscopy may also be done to remove a sample of your tissue to be checked under a microscope (biopsy). It may also be done to remove or destroy bladder stones. LET YOUR CAREGIVER KNOW ABOUT:  Allergies to food or medicine.  Medicines taken, including  vitamins, herbs, eyedrops, over-the-counter medicines, and creams.  Use of steroids (by mouth or creams).  Previous problems with anesthetics or numbing medicines.  History of bleeding problems or blood clots.  Previous surgery.  Other health problems, including diabetes and kidney problems.  Possibility of pregnancy, if this applies. PROCEDURE The area around the opening to  your urethra will be cleaned. A medicine to numb your urethra (local anesthetic) is used. If a tissue sample or stone is removed during the procedure, you may be given a medicine to make you sleep (general anesthetic). Your caregiver will gently insert the tip of the cystoscope into your urethra. The cystoscope will be slowly glided through your urethra and into your bladder. Sterile fluid will flow through the cystoscope and into your bladder. The fluid will expand and stretch your bladder. This gives your caregiver a better view of your bladder walls. The procedure lasts about 15-20 minutes. AFTER THE PROCEDURE If a local anesthetic is used, you will be allowed to go home as soon as you are ready. If a general anesthetic is used, you will be taken to a recovery area until you are stable. You may have temporary bleeding and burning on urination.   This information is not intended to replace advice given to you by your health care provider. Make sure you discuss any questions you have with your health care provider.   Document Released: 01/23/2000 Document Revised: 02/15/2014 Document Reviewed: 07/19/2011 Elsevier Interactive Patient Education [employer].

## 2015-02-14 ENCOUNTER — Encounter (HOSPITAL_COMMUNITY)
Admission: RE | Admit: 2015-02-14 | Discharge: 2015-02-14 | Disposition: A | Discharge status: Home / Self Care | Source: Ambulatory Visit | Attending: Urology | Admitting: Urology

## 2015-02-14 ENCOUNTER — Encounter (HOSPITAL_COMMUNITY): Payer: Self-pay

## 2015-02-14 DIAGNOSIS — Z01818 Encounter for other preprocedural examination: Secondary | ICD-10-CM | POA: Diagnosis not present

## 2015-02-14 DIAGNOSIS — N201 Calculus of ureter: Secondary | ICD-10-CM | POA: Insufficient documentation

## 2015-02-14 HISTORY — DX: Pure hypercholesterolemia, unspecified: E78.00

## 2015-02-14 HISTORY — DX: Gastro-esophageal reflux disease without esophagitis: K21.9

## 2015-02-14 NOTE — Pre-Procedure Instructions (Signed)
Patient given information to sign up for my chart at home. 

## 2015-02-18 ENCOUNTER — Ambulatory Visit (HOSPITAL_COMMUNITY)

## 2015-02-18 ENCOUNTER — Encounter (HOSPITAL_COMMUNITY): Payer: Self-pay | Admitting: *Deleted

## 2015-02-18 ENCOUNTER — Encounter (HOSPITAL_COMMUNITY)
Admission: RE | Disposition: A | Discharge status: Home / Self Care | Payer: Self-pay | Source: Ambulatory Visit | Attending: Urology

## 2015-02-18 ENCOUNTER — Ambulatory Visit (HOSPITAL_COMMUNITY)
Admission: RE | Admit: 2015-02-18 | Discharge: 2015-02-18 | Disposition: A | Discharge status: Home / Self Care | Source: Ambulatory Visit | Attending: Urology | Admitting: Urology

## 2015-02-18 ENCOUNTER — Ambulatory Visit (HOSPITAL_COMMUNITY): Admitting: Anesthesiology

## 2015-02-18 DIAGNOSIS — N201 Calculus of ureter: Secondary | ICD-10-CM | POA: Diagnosis not present

## 2015-02-18 HISTORY — PX: CYSTOSCOPY W/ URETERAL STENT REMOVAL: SHX1430

## 2015-02-18 HISTORY — PX: CYSTOSCOPY/URETEROSCOPY/HOLMIUM LASER: SHX6545

## 2015-02-18 HISTORY — PX: HOLMIUM LASER APPLICATION: SHX5852

## 2015-02-18 SURGERY — CYSTOURETEROSCOPY, USING HOLMIUM LASER
Anesthesia: General | Laterality: Right

## 2015-02-18 MED ORDER — ARTIFICIAL TEARS OP OINT
TOPICAL_OINTMENT | OPHTHALMIC | Status: DC | PRN
  Administered 2015-02-18: 1 via OPHTHALMIC

## 2015-02-18 MED ORDER — EPHEDRINE SULFATE 50 MG/ML IJ SOLN
INTRAMUSCULAR | Status: AC
  Filled 2015-02-18: qty 1

## 2015-02-18 MED ORDER — MIDAZOLAM HCL 2 MG/2ML IJ SOLN
INTRAMUSCULAR | Status: AC
  Filled 2015-02-18: qty 2

## 2015-02-18 MED ORDER — ONDANSETRON HCL 4 MG/2ML IJ SOLN
4.0000 mg | Freq: Once | INTRAMUSCULAR | Status: AC
  Administered 2015-02-18: 4 mg via INTRAVENOUS
  Filled 2015-02-18: qty 2

## 2015-02-18 MED ORDER — LIDOCAINE HCL (CARDIAC) 20 MG/ML IV SOLN
INTRAVENOUS | Status: DC | PRN
  Administered 2015-02-18: 50 mg via INTRAVENOUS

## 2015-02-18 MED ORDER — WATER FOR IRRIGATION, STERILE IR SOLN
Status: DC | PRN
  Administered 2015-02-18: 1000 mL

## 2015-02-18 MED ORDER — LACTATED RINGERS IV SOLN
INTRAVENOUS | Status: DC
  Administered 2015-02-18: 12:00:00 via INTRAVENOUS

## 2015-02-18 MED ORDER — ARTIFICIAL TEARS OP OINT
TOPICAL_OINTMENT | OPHTHALMIC | Status: AC
  Filled 2015-02-18: qty 3.5

## 2015-02-18 MED ORDER — SUCCINYLCHOLINE CHLORIDE 20 MG/ML IJ SOLN
INTRAMUSCULAR | Status: AC
  Filled 2015-02-18: qty 1

## 2015-02-18 MED ORDER — MIDAZOLAM HCL 2 MG/2ML IJ SOLN
1.0000 mg | INTRAMUSCULAR | Status: DC | PRN
  Administered 2015-02-18: 2 mg via INTRAVENOUS

## 2015-02-18 MED ORDER — FENTANYL CITRATE (PF) 100 MCG/2ML IJ SOLN
INTRAMUSCULAR | Status: DC | PRN
  Administered 2015-02-18 (×2): 25 ug via INTRAVENOUS
  Administered 2015-02-18: 50 ug via INTRAVENOUS

## 2015-02-18 MED ORDER — FENTANYL CITRATE (PF) 100 MCG/2ML IJ SOLN: 25.0000 ug | INTRAMUSCULAR | Status: DC | PRN

## 2015-02-18 MED ORDER — LACTATED RINGERS IV SOLN
INTRAVENOUS | Status: DC | PRN
  Administered 2015-02-18: 12:00:00 via INTRAVENOUS

## 2015-02-18 MED ORDER — LIDOCAINE HCL (PF) 1 % IJ SOLN
INTRAMUSCULAR | Status: AC
  Filled 2015-02-18: qty 5

## 2015-02-18 MED ORDER — SODIUM CHLORIDE 0.9 % IR SOLN
Status: DC | PRN
  Administered 2015-02-18 (×2): 3000 mL

## 2015-02-18 MED ORDER — FENTANYL CITRATE (PF) 100 MCG/2ML IJ SOLN
25.0000 ug | INTRAMUSCULAR | Status: AC
  Administered 2015-02-18 (×2): 25 ug via INTRAVENOUS
  Filled 2015-02-18: qty 2

## 2015-02-18 MED ORDER — ONDANSETRON HCL 4 MG/2ML IJ SOLN: 4.0000 mg | Freq: Once | INTRAMUSCULAR | Status: DC | PRN

## 2015-02-18 MED ORDER — IOHEXOL 350 MG/ML SOLN
INTRAVENOUS | Status: DC | PRN
  Administered 2015-02-18: 6 mL

## 2015-02-18 MED ORDER — CEFAZOLIN SODIUM-DEXTROSE 2-3 GM-% IV SOLR
2.0000 g | INTRAVENOUS | Status: AC
  Administered 2015-02-18: 2 g via INTRAVENOUS
  Filled 2015-02-18: qty 50

## 2015-02-18 MED ORDER — SODIUM CHLORIDE 0.9 % IJ SOLN
INTRAMUSCULAR | Status: AC
  Filled 2015-02-18: qty 10

## 2015-02-18 MED ORDER — CEPHALEXIN 500 MG PO CAPS: 500.0000 mg | ORAL_CAPSULE | Freq: Two times a day (BID) | ORAL | Status: DC

## 2015-02-18 MED ORDER — DEXAMETHASONE SODIUM PHOSPHATE 4 MG/ML IJ SOLN
4.0000 mg | Freq: Once | INTRAMUSCULAR | Status: AC
  Administered 2015-02-18: 4 mg via INTRAVENOUS
  Filled 2015-02-18: qty 1

## 2015-02-18 MED ORDER — PROPOFOL 10 MG/ML IV BOLUS
INTRAVENOUS | Status: AC
  Filled 2015-02-18: qty 40

## 2015-02-18 MED ORDER — PROPOFOL 10 MG/ML IV BOLUS
INTRAVENOUS | Status: DC | PRN
  Administered 2015-02-18: 150 mg via INTRAVENOUS

## 2015-02-18 MED ORDER — FENTANYL CITRATE (PF) 100 MCG/2ML IJ SOLN
INTRAMUSCULAR | Status: AC
  Filled 2015-02-18: qty 2

## 2015-02-18 SURGICAL SUPPLY — 23 items
BAG DRAIN URO TABLE W/ADPT NS (DRAPE) ×2 IMPLANT
CATH INTERMIT  6FR 70CM (CATHETERS) ×2 IMPLANT
CLOTH BEACON ORANGE TIMEOUT ST (SAFETY) ×2 IMPLANT
ELECT REM PT RETURN 9FT ADLT (ELECTROSURGICAL) ×2
ELECTRODE REM PT RTRN 9FT ADLT (ELECTROSURGICAL) ×1 IMPLANT
EXTRACTOR STONE NITINOL NGAGE (UROLOGICAL SUPPLIES) ×2 IMPLANT
GLOVE BIOGEL M 8.0 STRL (GLOVE) ×2 IMPLANT
GLOVE BIOGEL PI IND STRL 7.0 (GLOVE) ×3 IMPLANT
GLOVE BIOGEL PI INDICATOR 7.0 (GLOVE) ×3
GLOVE ECLIPSE 6.5 STRL STRAW (GLOVE) ×2 IMPLANT
GOWN STRL REUS W/TWL LRG LVL3 (GOWN DISPOSABLE) ×2 IMPLANT
GOWN STRL REUS W/TWL XL LVL3 (GOWN DISPOSABLE) ×2 IMPLANT
GUIDEWIRE STR DUAL SENSOR (WIRE) ×2 IMPLANT
IV NS IRRIG 3000ML ARTHROMATIC (IV SOLUTION) ×4 IMPLANT
KIT ROOM TURNOVER AP CYSTO (KITS) ×2 IMPLANT
LASER FIBER DISP (UROLOGICAL SUPPLIES) ×2 IMPLANT
MANIFOLD NEPTUNE II (INSTRUMENTS) ×2 IMPLANT
PACK CYSTO (CUSTOM PROCEDURE TRAY) ×2 IMPLANT
PAD ARMBOARD 7.5X6 YLW CONV (MISCELLANEOUS) ×2 IMPLANT
SHEATH ACCESS URETERAL 24CM (SHEATH) ×2 IMPLANT
STENT URET 6FRX26 CONTOUR (STENTS) ×2 IMPLANT
SYRINGE IRR TOOMEY STRL 70CC (SYRINGE) ×2 IMPLANT
WATER STERILE IRR 1000ML POUR (IV SOLUTION) ×2 IMPLANT

## 2015-02-18 NOTE — Op Note (Signed)
PATIENT:  Carolyn Patrick  PRE-OPERATIVE DIAGNOSIS: Right ureteral stone  POST-OPERATIVE DIAGNOSIS: Same  PROCEDURE: Cystoscopy, right J2 stent removal/replacement ([id number] with string), right ureteroscopy with holmium laser litho, right ureteral stone extraction  SURGEON:  Bertram MillardStephen M. Leyli Kevorkian, M.D.  ANESTHESIA:  General  EBL:  Minimal  DRAINS: Previous mentioned stent  LOCAL MEDICATIONS USED:  None  SPECIMEN:  Stone fragments  INDICATION: Carolyn Patrick is a 55 year old female with a recent history of a right ureteral stone associated with an infection. She went urgent stenting of her right kidney about 2 weeks ago. She has received appropriate antibiotic management,, and at this point presents for ureteroscopic management of the stone. Risks and complications as well as alternatives of this procedure have been discussed with the patient. She desires to proceed.  Description of procedure: The patient was properly identified and marked (if applicable) in the holding area. They were then  taken to the operating room and placed on the table in a supine position. General anesthesia was then administered. Once fully anesthetized the patient was moved to the dorsolithotomy position and the genitalia and perineum were sterilely prepped and draped in standard fashion. An official timeout was then performed.  23 french cystoscope was placed in bladder. Systematic inspection normal except for stent at right ureteral orifice. Stent was extracted intact. 0.038 inch sensor tip guidewire advanced fluoroscopically to right upper pole calyces. Cystoscope removed, 6 fr semirigid ureteroscope was then advanced into right distal ureter. Stone engaged approximately 3 cm up ureter. It was impacted in the anterior wall of the ureter. I attempted to nudge the stone proximally with the beak of the scope, but it would not dislodge. I then tried to engage the stone with an engage basket. This was unsuccessful. I  then used a 365 micron fiber to apply holmium lase energy to fragment the stone into several pieces. These were then snared and extracted with the engage basket. Reinspection of the ureter with the ureteroscope all the way to the UPJ revealed no further stone burden.  The ureteroscope was then removed, and the wire was backloaded through the scope. I then replaced the 24 fr J2 stent using fluoroscopic guidance. Good proximal/distal curls were seen once the stent was engaged. The bladder was then drained and the scope removed. The patient was then awakened and taken to the PACU in stable condtion. She tolerated the procedure well.    PLAN OF CARE: Discharge to home after PACU  PATIENT DISPOSITION:  PACU - hemodynamically stable.

## 2015-02-18 NOTE — Anesthesia Postprocedure Evaluation (Signed)
Anesthesia Post Note  Patient: Carolyn Patrick  Procedure(s) Performed: Procedure(s) (LRB): CYSTOSCOPY/RIGHT URETEROSCOPY/HOLMIUM LASER LITHOTRIPSY/RIGHT URETEROSCOPIC STONE EXTRACTION (Right) CYSTOSCOPY WITH RIGHT URETERAL STENT REMOVAL (Right) HOLMIUM LASER APPLICATION (Right)  Patient location during evaluation: PACU Anesthesia Type: General Level of consciousness: awake and alert and oriented Pain management: pain level controlled Vital Signs Assessment: post-procedure vital signs reviewed and stable Respiratory status: spontaneous breathing Cardiovascular status: blood pressure returned to baseline Postop Assessment: no signs of nausea or vomiting Anesthetic complications: no    Last Vitals:  Filed Vitals:   02/18/15 1230 02/18/15 1245  BP: 124/85 136/78  Pulse:    Temp:    Resp: 15 24    Last Pain: There were no vitals filed for this visit.               Matsue Strom A

## 2015-02-18 NOTE — Discharge Instructions (Signed)
POSTOPERATIVE CARE AFTER URETEROSCOPY  Stent management  *Stents are often left in after ureteroscopy and stone treatment. If left in, they often cause urinary frequency, urgency, occasional blood in the urine, as well as flank discomfort with urination. These are all expected issues, and should resolve after the stent is removed. *Often times, a small thread is left on the end of the stent, and brought out through the urethra. If so, this is used to remove the stent, making it unnecessary to look in the bladder with a scope in the office to remove the stent. If a thread is left on, did not pull on it until instructed. Pull string to remove stent on Monday  Diet  Once you have adequately recovered from anesthesia, you may gradually advance your diet, as tolerated, to your regular diet.  Activities  You may gradually increase your activities to your normal unrestricted level the day following your procedure.  Medications  You should resume all preoperative medications. If you are on aspirin-like compounds, you should not resume these until the blood clears from your urine. If given an antibiotic by the surgeon, take these until they are completed. You may also be given, if you have a stent, medications to decrease the urinary frequency and urgency.  Pain  After ureteroscopy, there may be some pain on the side of the scope. Take your pain medicine for this. Usually, this pain resolves within a day or 2.  Fever  Please report any fever over 100 to the doctor. Ureteral Stent Implantation, Care After Refer to this sheet in the next few weeks. These instructions provide you with information on caring for yourself after your procedure. Your health care provider may also give you more specific instructions. Your treatment has been planned according to current medical practices, but problems sometimes occur. Call your health care provider if you have any problems or questions after your  procedure. WHAT TO EXPECT AFTER THE PROCEDURE You should be back to normal activity within 48 hours after the procedure. Nausea and vomiting may occur and are commonly the result of anesthesia. It is common to experience sharp pain in the back or lower abdomen and penis with voiding. This is caused by movement of the ends of the stent with the act of urinating.It usually goes away within minutes after you have stopped urinating. HOME CARE INSTRUCTIONS Make sure to drink plenty of fluids. You may have small amounts of bleeding, causing your urine to be red. This is normal. Certain movements may trigger pain or a feeling that you need to urinate. You may be given medicines to prevent infection or bladder spasms. Be sure to take all medicines as directed. Only take over-the-counter or prescription medicines for pain, discomfort, or fever as directed by your health care provider. Do not take aspirin, as this can make bleeding worse. Your stent will be left in until the blockage is resolved. This may take 2 weeks or longer, depending on the reason for stent implantation. You may have an X-ray exam to make sure your ureter is open and that the stent has not moved out of position (migrated). The stent can be removed by your health care provider in the office. Medicines may be given for comfort while the stent is being removed. Be sure to keep all follow-up appointments so your health care provider can check that you are healing properly. SEEK MEDICAL CARE IF:  You experience increasing pain.  Your pain medicine is not working. SEEK IMMEDIATE MEDICAL  CARE IF:  Your urine is dark red or has blood clots.  You are leaking urine (incontinent).  You have a fever, chills, feeling sick to your stomach (nausea), or vomiting.  Your pain is not relieved by pain medicine.  The end of the stent comes out of the urethra.  You are unable to urinate.   This information is not intended to replace advice given to  you by your health care provider. Make sure you discuss any questions you have with your health care provider.   Document Released: 09/27/2012 Document Revised: 01/30/2013 Document Reviewed: 08/09/2014 Elsevier Interactive Patient Education 2016 Elsevier Inc.  Anesthesia, Adult, Care After Refer to this sheet in the next few weeks. These instructions provide you with information on caring for yourself after your procedure. Your health care provider may also give you more specific instructions. Your treatment has been planned according to current medical practices, but problems sometimes occur. Call your health care provider if you have any problems or questions after your procedure. WHAT TO EXPECT AFTER THE PROCEDURE After the procedure, it is typical to experience:  Sleepiness.  Nausea and vomiting. HOME CARE INSTRUCTIONS  For the first 24 hours after general anesthesia:  Have a responsible person with you.  Do not drive a car. If you are alone, do not take public transportation.  Do not drink alcohol.  Do not take medicine that has not been prescribed by your health care provider.  Do not sign important papers or make important decisions.  You may resume a normal diet and activities as directed by your health care provider.  Change bandages (dressings) as directed.  If you have questions or problems that seem related to general anesthesia, call the hospital and ask for the anesthetist or anesthesiologist on call. SEEK MEDICAL CARE IF:  You have nausea and vomiting that continue the day after anesthesia.  You develop a rash. SEEK IMMEDIATE MEDICAL CARE IF:   You have difficulty breathing.  You have chest pain.  You have any allergic problems.   This information is not intended to replace advice given to you by your health care provider. Make sure you discuss any questions you have with your health care provider.   Document Released: 05/03/2000 Document Revised:  02/15/2014 Document Reviewed: 05/26/2011 Elsevier Interactive Patient Education [employer] Elsevier Inc.

## 2015-02-18 NOTE — Anesthesia Procedure Notes (Signed)
Procedure Name: LMA Insertion Date/Time: 02/18/2015 1:04 PM Performed by: Pernell DupreADAMS, AMY A Pre-anesthesia Checklist: Timeout performed, Patient identified, Emergency Drugs available, Suction available and Patient being monitored Patient Re-evaluated:Patient Re-evaluated prior to inductionOxygen Delivery Method: Circle system utilized Preoxygenation: Pre-oxygenation with 100% oxygen Intubation Type: IV induction Ventilation: Mask ventilation without difficulty LMA: LMA inserted LMA Size: 4.0 Number of attempts: 1 Placement Confirmation: positive ETCO2 and breath sounds checked- equal and bilateral Tube secured with: Tape Dental Injury: Teeth and Oropharynx as per pre-operative assessment

## 2015-02-18 NOTE — H&P (Signed)
  H&P  Chief Complaint: kidney stone  History of Present Illness: Carolyn Patrick is a 55 y.o. year old femalewho presents now for cystoscopy, right double-J stent extraction, retrograde ureteropyelogram and management of her right distal ureteral stone.  Her history as below:  .  She presented to the emergency room in early December, 2016 with a 5 mm right distal ureteral stone and was appropriately sent home on medical expulsive therapy.  She returned 4-5 days later with a high fever, tachycardia and obvious distress from an infected stone.  She then underwent urgent right double-J stent placement, hospitalization and management of what turned out to be Escherichia coli septicemia.   She has not had symptoms of a urinary tract infection since she left the hospital.  She has had no blood in her urine, and no significant issues with the stent.   Past Medical History  Diagnosis Date  . Renal disorder     kidney stones  . Hypercholesteremia   . GERD (gastroesophageal reflux disease)     Past Surgical History  Procedure Laterality Date  . Knee surgery Right     arthroscopy and meniscal repair  . Cystoscopy w/ ureteral stent placement Right 01/12/2015    Procedure: CYSTOSCOPY WITH RETROGRADE PYELOGRAM/URETERAL STENT PLACEMENT;  Surgeon: Marcine MatarStephen Thais Silberstein, MD;  Location: [facility];  Service: Urology;  Laterality: Right;    Home Medications:  No prescriptions prior to admission    Allergies: No Known Allergies  No family history on file.  Social History:  reports that she has never smoked. She does not have any smokeless tobacco history on file. She reports that she drinks alcohol. She reports that she does not use illicit drugs.  ROS: Genitourinary, constitutional, skin, eye, otolaryngeal, hematologic/lymphatic, cardiovascular, pulmonary, endocrine, musculoskeletal, gastrointestinal, neurological and psychiatric system(s) were reviewed and pertinent findings if present are noted and are  otherwise negative.  Genitourinary: urinary frequency, urinary urgency and nocturia.  Gastrointestinal: heartburn.  Constitutional: feeling tired (fatigue).      Physical Exam:  Vital signs in last 24 hours:   General:  Alert and oriented, No acute distress HEENT: Normocephalic, atraumatic Neck: No JVD or lymphadenopathy Cardiovascular: Regular rate and rhythm Lungs: Clear bilaterally Abdomen: Soft, nontender, nondistended, no abdominal masses Back: No CVA tenderness Extremities: No edema Neurologic: Grossly intact  Laboratory Data:  No results found for this or any previous visit (from the past 24 hour(s)). No results found for this or any previous visit (from the past 240 hour(s)). Creatinine: No results for input(s): CREATININE in the last 168 hours.  Radiologic Imaging: No results found.  Impression/Assessment:  Right ureteral stone, with history of infection, status post stenting  Plan:  Cystoscopy, right double-J stent extraction, right retrograde ureteral pyelogram, ureteroscopic management of right ureteral stone, possibly with holmium laser lithotripsy  Marcine MatarDAHLSTEDT, Jendaya Gossett M 02/18/2015, 8:35 AM  Bertram MillardStephen M. Jacia Sickman MD

## 2015-02-18 NOTE — Transfer of Care (Signed)
Immediate Anesthesia Transfer of Care Note  Patient: Carolyn LofflerWanda A White  Procedure(s) Performed: Procedure(s): CYSTOSCOPY/RIGHT URETEROSCOPY/HOLMIUM LASER LITHOTRIPSY/RIGHT URETEROSCOPIC STONE EXTRACTION (Right) CYSTOSCOPY WITH RIGHT URETERAL STENT REMOVAL (Right) HOLMIUM LASER APPLICATION (Right)  Patient Location: PACU  Anesthesia Type:General  Level of Consciousness: awake, alert , oriented and patient cooperative  Airway & Oxygen Therapy: Patient Spontanous Breathing and Patient connected to face mask oxygen  Post-op Assessment: Report given to RN and Post -op Vital signs reviewed and stable  Post vital signs: Reviewed and stable  Last Vitals:  Filed Vitals:   02/18/15 1230 02/18/15 1245  BP: 124/85 136/78  Pulse:    Temp:    Resp: 15 24    Complications: No apparent anesthesia complications

## 2015-02-18 NOTE — Anesthesia Preprocedure Evaluation (Addendum)
Anesthesia Evaluation  Patient identified by MRN, date of birth, ID band Patient awake    Reviewed: Allergy & Precautions, NPO status , Patient's Chart, lab work & pertinent test results  History of Anesthesia Complications Negative for: history of anesthetic complications  Airway Mallampati: I  TM Distance: >3 FB Neck ROM: Full    Dental  (+) Teeth Intact   Pulmonary neg pulmonary ROS,    breath sounds clear to auscultation       Cardiovascular Exercise Tolerance: Good  Rhythm:Regular Rate:Normal     Neuro/Psych    GI/Hepatic Neg liver ROS, GERD  Medicated and Controlled,  Endo/Other    Renal/GU Renal disease (r ureteral stone)   Renal calculi    Musculoskeletal   Abdominal   Peds  Hematology   Anesthesia Other Findings   Reproductive/Obstetrics                            Anesthesia Physical Anesthesia Plan  ASA: II  Anesthesia Plan: General   Post-op Pain Management:    Induction: Intravenous  Airway Management Planned: LMA  Additional Equipment:   Intra-op Plan:   Post-operative Plan: Extubation in OR  Informed Consent: I have reviewed the patients History and Physical, chart, labs and discussed the procedure including the risks, benefits and alternatives for the proposed anesthesia with the patient or authorized representative who has indicated his/her understanding and acceptance.     Plan Discussed with: Surgeon  Anesthesia Plan Comments:         Anesthesia Quick Evaluation

## 2015-02-18 NOTE — Progress Notes (Signed)
Patient given discharge instructions written and verbally.  Pt concerned that the Keflex would not be delivered to her home for 3 days and also the oxycodone she has makes her nauseated.  Dr Retta Dionesahlstedt notified and to [facility] [place]

## 2015-02-19 ENCOUNTER — Encounter (HOSPITAL_COMMUNITY): Payer: Self-pay | Admitting: Urology

## 2015-03-25 ENCOUNTER — Ambulatory Visit (INDEPENDENT_AMBULATORY_CARE_PROVIDER_SITE_OTHER): Admitting: Urology

## 2015-03-25 ENCOUNTER — Other Ambulatory Visit: Payer: Self-pay | Admitting: Urology

## 2015-03-25 DIAGNOSIS — N1330 Unspecified hydronephrosis: Secondary | ICD-10-CM

## 2015-03-25 DIAGNOSIS — N201 Calculus of ureter: Secondary | ICD-10-CM | POA: Diagnosis not present

## 2015-05-26 ENCOUNTER — Ambulatory Visit (HOSPITAL_COMMUNITY)
Admission: RE | Admit: 2015-05-26 | Discharge: 2015-05-26 | Disposition: A | Discharge status: Home / Self Care | Source: Ambulatory Visit | Attending: Urology | Admitting: Urology

## 2015-05-26 DIAGNOSIS — N1330 Unspecified hydronephrosis: Secondary | ICD-10-CM | POA: Insufficient documentation

## 2015-06-03 ENCOUNTER — Ambulatory Visit: Admitting: Urology

## 2015-06-23 ENCOUNTER — Other Ambulatory Visit (HOSPITAL_COMMUNITY)

## 2015-07-22 ENCOUNTER — Ambulatory Visit (INDEPENDENT_AMBULATORY_CARE_PROVIDER_SITE_OTHER): Admitting: Urology

## 2015-07-22 DIAGNOSIS — N200 Calculus of kidney: Secondary | ICD-10-CM

## 2016-06-22 ENCOUNTER — Other Ambulatory Visit (HOSPITAL_COMMUNITY): Payer: Self-pay | Admitting: Family Medicine

## 2016-06-22 ENCOUNTER — Ambulatory Visit (HOSPITAL_COMMUNITY)
Admission: RE | Admit: 2016-06-22 | Discharge: 2016-06-22 | Disposition: A | Discharge status: Home / Self Care | Source: Ambulatory Visit | Attending: Family Medicine | Admitting: Family Medicine

## 2016-06-22 DIAGNOSIS — M542 Cervicalgia: Secondary | ICD-10-CM

## 2016-06-22 DIAGNOSIS — M25512 Pain in left shoulder: Secondary | ICD-10-CM

## 2016-06-22 DIAGNOSIS — M50322 Other cervical disc degeneration at C5-C6 level: Secondary | ICD-10-CM | POA: Insufficient documentation

## 2016-06-22 DIAGNOSIS — M4802 Spinal stenosis, cervical region: Secondary | ICD-10-CM | POA: Insufficient documentation

## 2017-02-18 ENCOUNTER — Telehealth: Payer: Self-pay

## 2017-02-18 NOTE — Telephone Encounter (Signed)
Sent notes to scheduling 

## 2017-03-24 ENCOUNTER — Ambulatory Visit: Admitting: Internal Medicine

## 2017-04-07 ENCOUNTER — Ambulatory Visit (INDEPENDENT_AMBULATORY_CARE_PROVIDER_SITE_OTHER): Admitting: Internal Medicine

## 2017-04-07 ENCOUNTER — Encounter: Payer: Self-pay | Admitting: Internal Medicine

## 2017-04-07 VITALS — BP 132/80 | HR 70 | Ht 66.0 in | Wt 190.0 lb

## 2017-04-07 DIAGNOSIS — R0602 Shortness of breath: Secondary | ICD-10-CM | POA: Diagnosis not present

## 2017-04-07 DIAGNOSIS — E7800 Pure hypercholesterolemia, unspecified: Secondary | ICD-10-CM

## 2017-04-07 NOTE — Patient Instructions (Addendum)
Medication Instructions:  Your physician recommends that you continue on your current medications as directed. Please refer to the Current Medication list given to you today.  -- If you need a refill on your cardiac medications before your next appointment, please call your pharmacy. --  Labwork: None ordered  Testing/Procedures:  PLE[facility] schedule at [facility]  Your physician has requested that you have an echocardiogram. Echocardiography is a painless test that uses sound waves to create images of your heart. It provides your doctor with information about the size and shape of your heart and how well your heart's chambers and valves are working. This procedure takes approximately one hour. There are no restrictions for this procedure.  Your physician has requested that you have an exercise tolerance test. For further information please visit [email or web address]. Please also follow instruction sheet, as given.   Follow-Up: Your physician wants you to follow-up in: 6 weeks with Dr. Okey DupreEnd or APP   Thank you for choosing [facility]!!    Any Other Special Instructions Will Be Listed Below (If Applicable).   Please bring Calcium Score Results to appointment

## 2017-04-07 NOTE — Progress Notes (Signed)
New Outpatient Visit Date: 04/07/2017  Referring Provider: Anselmo Pickler, MD [address] [place], [place] [address]  Chief Complaint: Shortness of breath  HPI:  Carolyn Patrick is a 57 y.o. female who is being seen today for the evaluation of shortness of breath at the request of Dr. Landis Martins. She has a history of hyperlipidemia and nephrolithiasis. She notes that for the last several months, she has had intermittent shortness of breath with the sensation of not being able to get a good breath. Others have told her that it sometimes appears that she is gasping for air. This can happen at any time but is most pronounced when she is sitting still. The episodes last anywhere from a few minutes to several hours. She does not have any exertional dyspnea. She denies a history of lung disease or prior pulmonary evaluation.  Carolyn Patrick reports intermittent heartburn and acid reflux, which she describes as a burning sensation in her chest and upper abdomen after eating. She otherwise denies chest pain, particularly with exertion. She denies palpitations, lightheadedness, orthopnea, PND, and edema. She underwent coronary calcium scoring in [place] last year and reports that mild calcification was noted (possibly in the LAD). Carolyn Patrick has not undergone other cardiac testing, including echo, stress testing, or cardiac catheterization.  --------------------------------------------------------------------------------------------------  Cardiovascular History & Procedures: Cardiovascular Problems:  Shortness of breath  Coronary artery calcification  Risk Factors:  Hyperlipidemia, obesity, and family history  Cath/PCI:  None  CV Surgery:  None  EP Procedures and Devices:  None  Non-Invasive Evaluation(s):  Coronary calcium score: "Mild" calcification reported by the patient. Further details unknown.  Recent CV Pertinent Labs: Lab Results  Component Value Date   K 3.8 01/14/2015     BUN 11 01/14/2015   CREATININE 1.26 (H) 01/14/2015    --------------------------------------------------------------------------------------------------  Past Medical History:  Diagnosis Date  . GERD (gastroesophageal reflux disease)   . Hypercholesteremia   . Renal disorder    kidney stones    Past Surgical History:  Procedure Laterality Date  . CYSTOSCOPY W/ URETERAL STENT PLACEMENT Right 01/12/2015   Procedure: CYSTOSCOPY WITH RETROGRADE PYELOGRAM/URETERAL STENT PLACEMENT;  Surgeon: Marcine Matar, MD;  Location: [facility];  Service: Urology;  Laterality: Right;  . CYSTOSCOPY W/ URETERAL STENT REMOVAL Right 02/18/2015   Procedure: CYSTOSCOPY WITH RIGHT URETERAL STENT REMOVAL;  Surgeon: Marcine Matar, MD;  Location: [facility];  Service: Urology;  Laterality: Right;  . CYSTOSCOPY/URETEROSCOPY/HOLMIUM LASER Right 02/18/2015   Procedure: CYSTOSCOPY/RIGHT URETEROSCOPY/HOLMIUM LASER LITHOTRIPSY/RIGHT URETEROSCOPIC STONE EXTRACTION;  Surgeon: Marcine Matar, MD;  Location: [facility];  Service: Urology;  Laterality: Right;  . HOLMIUM LASER APPLICATION Right 02/18/2015   Procedure: HOLMIUM LASER APPLICATION;  Surgeon: Marcine Matar, MD;  Location: [facility];  Service: Urology;  Laterality: Right;  . KNEE SURGERY Right    arthroscopy and meniscal repair    Current Meds  Medication Sig  . hydrochlorothiazide (HYDRODIURIL) 25 MG tablet Take 25 mg by mouth daily.   Marland Kitchen lovastatin (MEVACOR) 20 MG tablet Take 20 mg by mouth daily.     Allergies: Patient has no known allergies.  Social History   Socioeconomic History  . Marital status: Married    Spouse name: Not on file  . Number of children: Not on file  . Years of education: Not on file  . Highest education level: Not on file  Social Needs  . Financial resource strain: Not on file  . Food insecurity - worry: Not on file  . Food insecurity -  inability: Not on file  . Transportation needs - medical: Not on file  . Transportation  needs - non-medical: Not on file  Occupational History  . Not on file  Tobacco Use  . Smoking status: Never Smoker  . Smokeless tobacco: Never Used  Substance and Sexual Activity  . Alcohol use: Yes    Comment: few drinks per year  . Drug use: No  . Sexual activity: Yes    Birth control/protection: None  Other Topics Concern  . Not on file  Social History Narrative  . Not on file    Family History  Problem Relation Age of Onset  . Heart disease Mother        Heart valve  . Stroke Mother   . Coronary artery disease Mother [id number]  . Heart disease Father   . Heart attack Father 1355  . Heart failure Brother   . Coronary artery disease Brother 3855  . Heart failure Brother   . Coronary artery disease Brother 55       stents  . Heart disease Maternal Grandmother   . Heart attack Paternal Grandfather [id number]    Review of Systems: A 12-system review of systems was performed and was negative except as noted in the HPI.  --------------------------------------------------------------------------------------------------  Physical Exam: BP 132/80 (BP Location: Left Arm, Patient Position: Sitting, Cuff Size: Normal)   Pulse 70   Ht 5\' 6"  (1.676 m)   Wt 190 lb (86.2 kg)   SpO2 96%   BMI 30.67 kg/m   General:  NAD. HEENT: No conjunctival pallor or scleral icterus. Moist mucous membranes. OP clear. Neck: Supple without lymphadenopathy, thyromegaly, JVD, or HJR. No carotid bruit. Lungs: Normal work of breathing. Clear to auscultation bilaterally without wheezes or crackles. Heart: Regular rate and rhythm without murmurs, rubs, or gallops. Non-displaced PMI. Abd: Bowel sounds present. Soft, NT/ND without hepatosplenomegaly Ext: No lower extremity edema. Radial, PT, and DP pulses are 2+ bilaterally Skin: Warm and dry without rash. Neuro: CNIII-XII intact. Strength and fine-touch sensation intact in upper and lower extremities bilaterally. Psych: Normal mood and affect.  EKG:  Normal sinus  rhythm with sinus arrhythmia. No significant abnormalities.  Outside labs (01/06/17): CMP: Na 141, K 4.2, Cl 96, CO2 25, BUN 11, creatinine 0.81, glucose 71, Ca 9.5, AST 23, ALT 15, AP 52, Tbili 0.7, TP 7.6, albumin 5.0  Lipid panel: total cholesterol 222, triglycerides 274, HDL 45, LDL 122  CBC: WBC 9.0, HGB 14.3, HCT 43.8, PLT 247  --------------------------------------------------------------------------------------------------  ASSESSMENT AND PLAN: Shortness of breath Dyspnea is not exertional and seems to come on randomly, lasting minutes to hours. There are not clear triggers or associated symotoms.Physical exam and EKG today are normal. We have agreed to obtain a transthoracic echocardiogram and exercise tolerance test for further evaluation.  Coronary artery calcification and family history of ASCVD I have asked Carolyn Patrick to bring a copy of her calcium score report with her to her follow-up appointment. If she has significant CAC, we will need to consider escalating her stating in order to achieve better lipid control for primary prevention.  Mixed hyperlipidemia Patient noted to have elevated LDL and triglycerides on outside lipid panel in 12/2016. She should continue with lovastatin for now. However, based on results o the aforementioned testing and review of her coronary calcium score, we will likely need to consider escalation of statin therapy to a moderate- to high-potency agent as well as addition of fish oil. Lifestyle modifications (diet and exercise) were also  encouraged.  Follow-up: Return to clinic in 6 weeks.  [name], MD 04/08/2017 5:11 PM

## 2017-04-08 ENCOUNTER — Encounter: Payer: Self-pay | Admitting: Internal Medicine

## 2017-04-25 ENCOUNTER — Ambulatory Visit (HOSPITAL_COMMUNITY)
Admission: RE | Admit: 2017-04-25 | Discharge: 2017-04-25 | Disposition: A | Discharge status: Home / Self Care | Source: Ambulatory Visit | Attending: Internal Medicine | Admitting: Internal Medicine

## 2017-04-25 DIAGNOSIS — E7800 Pure hypercholesterolemia, unspecified: Secondary | ICD-10-CM

## 2017-04-25 DIAGNOSIS — R0602 Shortness of breath: Secondary | ICD-10-CM | POA: Diagnosis not present

## 2017-04-25 LAB — EXERCISE TOLERANCE TEST
CSEPEW: 17.5 METS
CSEPPHR: 166 {beats}/min
Exercise duration (min): 13 min
Exercise duration (sec): 30 s
MPHR: 165 {beats}/min
Percent HR: 100 %
RPE: 12
Rest HR: 56 {beats}/min

## 2017-04-25 NOTE — Progress Notes (Signed)
*  PRELIMINARY RESULTS* Echocardiogram 2D Echocardiogram has been performed.  Stacey DrainWhite, Mohit Zirbes J 04/25/2017, 9:26 AM

## 2017-04-27 ENCOUNTER — Other Ambulatory Visit: Payer: Self-pay

## 2017-04-27 DIAGNOSIS — R0602 Shortness of breath: Secondary | ICD-10-CM

## 2017-05-04 ENCOUNTER — Ambulatory Visit (HOSPITAL_COMMUNITY)
Admission: RE | Admit: 2017-05-04 | Discharge: 2017-05-04 | Disposition: A | Discharge status: Home / Self Care | Source: Ambulatory Visit | Attending: Internal Medicine | Admitting: Internal Medicine

## 2017-05-04 DIAGNOSIS — R0602 Shortness of breath: Secondary | ICD-10-CM | POA: Diagnosis not present

## 2017-05-04 MED ORDER — PERFLUTREN LIPID MICROSPHERE
1.0000 mL | INTRAVENOUS | Status: AC | PRN
  Administered 2017-05-04: 2 mL via INTRAVENOUS
  Administered 2017-05-04: 1 mL via INTRAVENOUS
  Filled 2017-05-04: qty 10

## 2017-05-04 NOTE — Progress Notes (Signed)
*  PRELIMINARY RESULTS* Echocardiogram Limited 2-D Echocardiogram has been performed with Definity.  Stacey DrainWhite, Elvena Oyer J 05/04/2017, 11:36 AM

## 2017-05-17 ENCOUNTER — Encounter: Payer: Self-pay | Admitting: Internal Medicine

## 2017-05-19 ENCOUNTER — Ambulatory Visit (INDEPENDENT_AMBULATORY_CARE_PROVIDER_SITE_OTHER): Admitting: Internal Medicine

## 2017-05-19 ENCOUNTER — Encounter: Payer: Self-pay | Admitting: Internal Medicine

## 2017-05-19 VITALS — BP 120/82 | HR 61 | Ht 66.0 in | Wt 179.0 lb

## 2017-05-19 DIAGNOSIS — E785 Hyperlipidemia, unspecified: Secondary | ICD-10-CM

## 2017-05-19 DIAGNOSIS — E782 Mixed hyperlipidemia: Secondary | ICD-10-CM | POA: Insufficient documentation

## 2017-05-19 DIAGNOSIS — I2584 Coronary atherosclerosis due to calcified coronary lesion: Secondary | ICD-10-CM | POA: Diagnosis not present

## 2017-05-19 DIAGNOSIS — R0602 Shortness of breath: Secondary | ICD-10-CM

## 2017-05-19 DIAGNOSIS — I2510 Atherosclerotic heart disease of native coronary artery without angina pectoris: Secondary | ICD-10-CM

## 2017-05-19 DIAGNOSIS — R0609 Other forms of dyspnea: Secondary | ICD-10-CM | POA: Insufficient documentation

## 2017-05-19 LAB — LIPID PANEL
CHOLESTEROL TOTAL: 204 mg/dL — AB (ref 100–199)
Chol/HDL Ratio: 4.9 ratio — ABNORMAL HIGH (ref 0.0–4.4)
HDL: 42 mg/dL (ref >39)
LDL CALC: 125 mg/dL — AB (ref 0–99)
TRIGLYCERIDES: 184 mg/dL — AB (ref 0–149)
VLDL Cholesterol Cal: 37 mg/dL (ref 5–40)

## 2017-05-19 LAB — ALT: ALT: 18 IU/L (ref 0–32)

## 2017-05-19 MED ORDER — ASPIRIN EC 81 MG PO TBEC: 81.0000 mg | DELAYED_RELEASE_TABLET | Freq: Every day | ORAL | 3 refills | Status: DC

## 2017-05-19 MED ORDER — FAMOTIDINE 20 MG PO TABS: 20.0000 mg | ORAL_TABLET | Freq: Two times a day (BID) | ORAL | 0 refills | Status: DC

## 2017-05-19 NOTE — Progress Notes (Signed)
Follow-up Outpatient Visit Date: 05/19/2017  Primary Care Provider: Christ Kick, [place] [address]  Chief Complaint: Shortness of breath  HPI:  Carolyn Patrick is a 57 y.o. year-old female with history of HLD and nephrolithiasis, who presents for follow-up of shortness of breath.  I met Carolyn Patrick in late February, at which time she reported intermittent shortness of breath for several months (most commonly when sitting still).  She also described occasional burning in the chest and upper abdomen after eating.  We agreed to obtain ETT and echo (see details below).  Today, Carolyn Patrick reports feeling about the same.  She still has occasional shortness of breath, often when coughing.  The dyspnea is not exertional; she is able to exercise without any significant shortness of breath or chest pain.  She also denies edema, orthopnea, and PND.  She has rare flutters in the chest that last a second or two; there are no associated symptoms.  Carolyn Patrick has lost over 20 pounds this year.  Carolyn Patrick has notes that her seasonal allergies have started to flair over the last few days.  She is using pseudoephedrine and an antihistamine for relief.  She brings with her reports of coronary artery calcium scoring from 2010 and 2016 in [place]; most recent study showed CAC of 14 (80th percentile for age/gender).  --------------------------------------------------------------------------------------------------  Cardiovascular History & Procedures: Cardiovascular Problems:  Shortness of breath  Coronary artery calcification  Risk Factors:  Hyperlipidemia, obesity, and family history  Cath/PCI:  None  CV Surgery:  None  EP Procedures and Devices:  None  Non-Invasive Evaluation(s):  Limited TTE with Definity (05/04/17): Normal LV size with mild LVH.  LVEF 50-55% with normal wall motion.  ETT (04/25/17): Good exercise capacity without significant ST/T changes  or arrhythmias.  Exercised 13:30 min (17.5 METS) achieving 166 bpm (100% MPHR).  TTE (04/25/17): Normal LV size and wall thicknes.  LVEF 45-50% (technically difficult study).  Normal RV size and function.  No significant valvular abnormalities.  Coronary calcium score: 14 (80th percentile for age/gender) in 2016, 3 in 2010.  Recent CV Pertinent Labs: Lab Results  Component Value Date   K 3.8 01/14/2015   BUN 11 01/14/2015   CREATININE 1.26 (H) 01/14/2015    Past medical and surgical history were reviewed and updated in EPIC.  Current Meds  Medication Sig  . hydrochlorothiazide (HYDRODIURIL) 25 MG tablet Take 25 mg by mouth daily.   Marland Kitchen lovastatin (MEVACOR) 20 MG tablet Take 20 mg by mouth daily.     Allergies: Patient has no known allergies.  Social History   Socioeconomic History  . Marital status: Married    Spouse name: Not on file  . Number of children: Not on file  . Years of education: Not on file  . Highest education level: Not on file  Occupational History  . Not on file  Social Needs  . Financial resource strain: Not on file  . Food insecurity:    Worry: Not on file    Inability: Not on file  . Transportation needs:    Medical: Not on file    Non-medical: Not on file  Tobacco Use  . Smoking status: Never Smoker  . Smokeless tobacco: Never Used  Substance and Sexual Activity  . Alcohol use: Yes    Comment: few drinks per year  . Drug use: No  . Sexual activity: Yes    Birth control/protection: None  Lifestyle  . Physical  activity:    Days per week: Not on file    Minutes per session: Not on file  . Stress: Not on file  Relationships  . Social connections:    Talks on phone: Not on file    Gets together: Not on file    Attends religious service: Not on file    Active member of club or organization: Not on file    Attends meetings of clubs or organizations: Not on file    Relationship status: Not on file  . Intimate partner violence:    Fear of  current or ex partner: Not on file    Emotionally abused: Not on file    Physically abused: Not on file    Forced sexual activity: Not on file  Other Topics Concern  . Not on file  Social History Narrative  . Not on file    Family History  Problem Relation Age of Onset  . Heart disease Mother        Heart valve  . Stroke Mother   . Coronary artery disease Mother 47  . Heart disease Father   . Heart attack Father 49  . Heart failure Brother   . Coronary artery disease Brother 77  . Heart failure Brother   . Coronary artery disease Brother 55       stents  . Heart disease Maternal Grandmother   . Heart attack Paternal Grandfather 6    Review of Systems: A 12-system review of systems was performed and was negative except as noted in the HPI.  --------------------------------------------------------------------------------------------------  Physical Exam: BP 120/82   Pulse 61   Ht '5\' 6"'$  (1.676 m)   Wt 179 lb (81.2 kg)   SpO2 96%   BMI 28.89 kg/m   General:  NA HEENT: No conjunctival pallor or scleral icterus. Moist mucous membranes.  OP clear. Neck: Supple without lymphadenopathy, thyromegaly, JVD, or HJR. No carotid bruit. Lungs: Normal work of breathing. Clear to auscultation bilaterally without wheezes or crackles. Heart: Regular rate and rhythm without murmurs, rubs, or gallops. Non-displaced PMI. Abd: Bowel sounds present. Soft, NT/ND without hepatosplenomegaly Ext: No lower extremity edema. Radial, PT, and DP pulses are 2+ bilaterally. Skin: Warm and dry without rash.  Lab Results  Component Value Date   WBC 10.5 01/14/2015   HGB 10.0 (L) 01/14/2015   HCT 30.3 (L) 01/14/2015   MCV 87.8 01/14/2015   PLT 248 01/14/2015    Lab Results  Component Value Date   NA 137 01/14/2015   K 3.8 01/14/2015   CL 105 01/14/2015   CO2 26 01/14/2015   BUN 11 01/14/2015   CREATININE 1.26 (H) 01/14/2015   GLUCOSE 101 (H) 01/14/2015   ALT 26 01/13/2015    No  results found for: CHOL, HDL, LDLCALC, LDLDIRECT, TRIG, CHOLHDL  --------------------------------------------------------------------------------------------------  ASSESSMENT AND PLAN: Shortness of breath Dyspnea seems to accompany coughing; question silent acid reflux and/or post-nasal drip.  I have recommend using famotidine 20 mg BID x 1 months, as well as an oral antihistamine +/- nasal steroid to see if her symptoms improve.  I do not believe that her symptoms are due to cardiac pathology.  She demonstrated excellent exercise capacity during recent treadmill stress test.  No further cardiac testing is recommended at this time.  Hyperlipidemia Given coronary artery calcification on prior chest CT (low coronary calcium score, 80th percentile for age/gender matched controls), I think it would be beneficial to aim for an LDL goal < 100.  We  will check a lipid panel and ALT today and consider escalating statin therapy if her LDL is not at goal.  Coronary artery calcification Overall, low risk by Agatston score but 80th percentile for age/gender matched controls.  ETT was reassuring.  We will optimize lipid management, as above.  I have also recommended that Carolyn Patrick consider starting ASA 81 mg daily.  Follow-up: Return to clinic as needed.  Nelva Bush, MD 05/19/2017 9:36 AM

## 2017-05-19 NOTE — Patient Instructions (Signed)
Medication Instructions:  1) START Aspirin 81mg  once daily 2) START Famotidine (Pepcid) 20mg  twice daily for one month  Labwork: Lipid and ALT today  Testing/Procedures: None  Follow-Up: Your physician recommends that you schedule a follow-up appointment as needed with Dr. Okey DupreEnd.     Any Other Special Instructions Will Be Listed Below (If Applicable).     If you need a refill on your cardiac medications before your next appointment, please call your pharmacy.

## 2017-05-20 ENCOUNTER — Other Ambulatory Visit: Payer: Self-pay | Admitting: *Deleted

## 2017-05-20 ENCOUNTER — Telehealth: Payer: Self-pay | Admitting: *Deleted

## 2017-05-20 DIAGNOSIS — E785 Hyperlipidemia, unspecified: Secondary | ICD-10-CM

## 2017-05-20 MED ORDER — ATORVASTATIN CALCIUM 20 MG PO TABS: 20.0000 mg | ORAL_TABLET | Freq: Every day | ORAL | 3 refills | Status: DC

## 2017-05-20 NOTE — Telephone Encounter (Signed)
-----   Message from Yvonne Kendallhristopher End, MD sent at 05/19/2017  8:31 PM EDT ----- Please let Ms. Carolyn Patrick know that her cholesterol is suboptimally controlled, with an LDL of 125.  I recommend that we switch lovastatin to atorvastatin 20 mg daily and repeat a fasting lipid panel and ALT in ~3 months.

## 2017-05-20 NOTE — Telephone Encounter (Signed)
Spoke with pt and went over lab results and recommendations per Dr. Okey DupreEnd.  Pt verbalized understanding and was in agreement with this plan.  Pt will have repeat labs drawn in [place].  I have mailed lab requisition sheets to pt and placed orders in Epic. Pt preferred medication be sent to [facility].  Advised pt to continue Lovastatin until Atorvastatin is received.

## 2017-07-07 ENCOUNTER — Telehealth: Payer: Self-pay | Admitting: Internal Medicine

## 2017-07-07 NOTE — Telephone Encounter (Signed)
Pt calling back returning call to Casimiro Needle. Please call pt.

## 2017-07-07 NOTE — Telephone Encounter (Signed)
Lpmtcb 5/30

## 2017-07-07 NOTE — Telephone Encounter (Signed)
Patient called in regards to her second Echo done which was coded incorrectly.    Patient was billed by [employer] so patient contacted insurance company and was told that she needed to contact billing office and have them resubmit the correct CPT code to cover this test.  I will send this to our billing department for review and follow up with the patient.    I advised patient to call our billing dept for follow up, if needed.  Please contact patient for further details.  Thank you

## 2017-07-07 NOTE — Telephone Encounter (Signed)
New Message    Patient is calling because she was requested to have another echocardiogram done with contrast. She advised that [facility] is requesting a referral for the echocardiogram with contrast and for it to be back dated so that the claim will go through. Please call

## 2017-07-12 NOTE — Telephone Encounter (Signed)
[name], please confirm how this is billed incorrectly. After further review, on DOS, 05/04/17 it was ordered and  billed as a limited ECHO, dx - shortness of breath. I don't see any reason to have this claim resubmitted.

## 2017-11-01 LAB — LIPID PANEL
CHOLESTEROL TOTAL: 242 mg/dL — AB (ref 100–199)
Chol/HDL Ratio: 5.4 ratio — ABNORMAL HIGH (ref 0.0–4.4)
HDL: 45 mg/dL (ref >39)
LDL Calculated: 150 mg/dL — ABNORMAL HIGH (ref 0–99)
TRIGLYCERIDES: 235 mg/dL — AB (ref 0–149)
VLDL CHOLESTEROL CAL: 47 mg/dL — AB (ref 5–40)

## 2017-11-01 LAB — ALT: ALT: 14 IU/L (ref 0–32)

## 2017-11-02 ENCOUNTER — Telehealth: Payer: Self-pay

## 2017-11-02 ENCOUNTER — Other Ambulatory Visit: Payer: Self-pay | Admitting: *Deleted

## 2017-11-02 DIAGNOSIS — E785 Hyperlipidemia, unspecified: Secondary | ICD-10-CM

## 2017-11-02 NOTE — Telephone Encounter (Signed)
-----   Message from Ivy M Martin, LPN sent at 11/02/2017  8:49 AM EDT -----   ----- Message ----- From: End, Christopher, MD Sent: 11/01/2017   9:37 AM EDT To: [facility] Triage  Please let Carolyn Patrick know that her lipids have worsening compared with 5 months ago.  Please confirm that she is taking atorvastatin.  If so, I recommend that we increase her dose to atorvastatin 40 mg daily with repeat lipid panel and ALT in ~3 months.  She should also work on lifestyle modifications. 

## 2017-11-02 NOTE — Telephone Encounter (Signed)
-----   Message from Ivy M Martin, LPN sent at 11/02/2017  8:49 AM EDT -----   ----- Message ----- From: End, Christopher, MD Sent: 11/01/2017   9:37 AM EDT To: Cv Div [facility] Triage  Please let Ms. Orndoff know that her lipids have worsening compared with 5 months ago.  Please confirm that she is taking atorvastatin.  If so, I recommend that we increase her dose to atorvastatin 40 mg daily with repeat lipid panel and ALT in ~3 months.  She should also work on lifestyle modifications. 

## 2017-11-02 NOTE — Telephone Encounter (Signed)
Mailed lab requisitions to patient

## 2017-11-02 NOTE — Telephone Encounter (Signed)
-----   Message from Loa Socks, LPN sent at 1/61/0960  8:49 AM EDT -----   ----- Message ----- From: [name], MD Sent: 11/01/2017   9:37 AM EDT To: [facility] Triage  Please let Ms. Canlas know that her lipids have worsening compared with 5 months ago.  Please confirm that she is taking atorvastatin.  If so, I recommend that we increase her dose to atorvastatin 40 mg daily with repeat lipid panel and ALT in ~3 months.  She should also work on lifestyle modifications.

## 2017-11-02 NOTE — Telephone Encounter (Signed)
Notes recorded by Sigurd Sos, RN on 11/02/2017 at 11:25 AM EDT Spoke with patient about results/recommendations. Unfortunately, she has not been taking her Atorvastatin 20 mg, which she will start now. She would like to have labs drawn in [place] in 3 months.

## 2017-11-11 ENCOUNTER — Telehealth: Payer: Self-pay | Admitting: Internal Medicine

## 2017-11-11 NOTE — Telephone Encounter (Signed)
The patient is calling because she resumed Atorvastatin recently, but it is causing her severe muscle pain in her legs.  I told her that I would forward to Dr End for further recommendation.  She verbalized understanding and was thankful for the call.

## 2017-11-11 NOTE — Telephone Encounter (Signed)
New Message   Pt c/o medication issue:  1. Name of Medication: atorvastatin (LIPITOR) 20 MG tablet  2. How are you currently taking this medication (dosage and times per day)?   3. Are you having a reaction (difficulty breathing--STAT)?   4. What is your medication issue? Patient is calling because she is unable to take lipitor. She states that it causes muscle cramps. Please call.

## 2017-11-14 ENCOUNTER — Telehealth: Payer: Self-pay

## 2017-11-14 MED ORDER — ATORVASTATIN CALCIUM 10 MG PO TABS: 10.0000 mg | ORAL_TABLET | Freq: Every day | ORAL | 3 refills | Status: DC

## 2017-11-14 NOTE — Telephone Encounter (Signed)
Spoke to patient who said that her leg cramps have already dissipated since stopping her Lipitor.  She is willing to cut the dosage in half to 10 mg daily.  Sound good to you?  Thank you.

## 2017-11-14 NOTE — Telephone Encounter (Signed)
Lipitor adjusted

## 2017-11-14 NOTE — Telephone Encounter (Signed)
Yes, let's try cutting atorvastatin to 10 mg daily.  If she tolerates it, we should recheck a lipid panel and ALT in ~3 months.  [name], MD [facility] Pager: [phone]

## 2017-11-14 NOTE — Telephone Encounter (Signed)
Let's have Carolyn Patrick stop taking atorvastatin and see if her cramps resolve.  If so, we may need to try a lower dose in the future.  [name], MD [facility] Pager: [phone]

## 2019-01-02 ENCOUNTER — Telehealth: Payer: Self-pay | Admitting: Internal Medicine

## 2019-01-02 DIAGNOSIS — E785 Hyperlipidemia, unspecified: Secondary | ICD-10-CM

## 2019-01-02 DIAGNOSIS — Z79899 Other long term (current) drug therapy: Secondary | ICD-10-CM

## 2019-01-02 NOTE — Telephone Encounter (Signed)
I doubt that vaginal bleeding was related to atorvastatin.  I agree with gyn evaluation.  If patient wishes to follow-up, I recommend scheduling an appointment with me or an APP at her convenience.  We should check a fasting lipid panel and CMP shortly before that visit.  If she prefers, she can have her PCP manage her lipids.  Nelva Bush, MD [facility] Pager: [phone]

## 2019-01-02 NOTE — Telephone Encounter (Signed)
Please call regarding Lipitor, she states she stopped taking and has a question regarding restarting it.

## 2019-01-02 NOTE — Telephone Encounter (Signed)
Patient verbalized understanding of Dr Darnelle Bos recommendations. She lives in [place], [place] and would like to get at [facility] near her there. Lab slips mailed to patient.  She has [facility] and will need a referral before scheduling an appointment. She will get the referral then call us back to schedule.  She is aware Dr End is in [place] and she is fine with that.

## 2019-01-02 NOTE — Telephone Encounter (Signed)
Called patient. Patient said she decided to stop the Atorvastatin about a year ago and work just on diet and exercise.  She is concerned now that she should be on it for her cholesterol.  She has a brother who just had bypass surgery and another one that does as well. She restarted taking the Lipitor and after 3 days had light vaginal bleeding.  She stopped the Lipitor and the bleeding continued for about another week and stopped. She is not sure if the Lipitor and vaginal bleeding are connected or not. She wanted to make sure. She has not been to see her PCP in a while. Advised patient she should contact PCP or GYN concerning the vaginal bleeding.  She asked about repeat lab work was needed. Advised I will check with Dr End. Last LIPID, ALT was 10/31/17. Last OV 05/2017.  In the meantime, I will route to Dr End to review.

## 2019-04-18 IMAGING — DX DG CERVICAL SPINE COMPLETE 4+V
5 series · 5 of 5 positions shown · non-contrast
Comparison: None.

CLINICAL DATA: Neck and left shoulder pain 6 months without known
injury.

EXAM:
CERVICAL SPINE - COMPLETE 4+ VIEW

[c-spine lat]
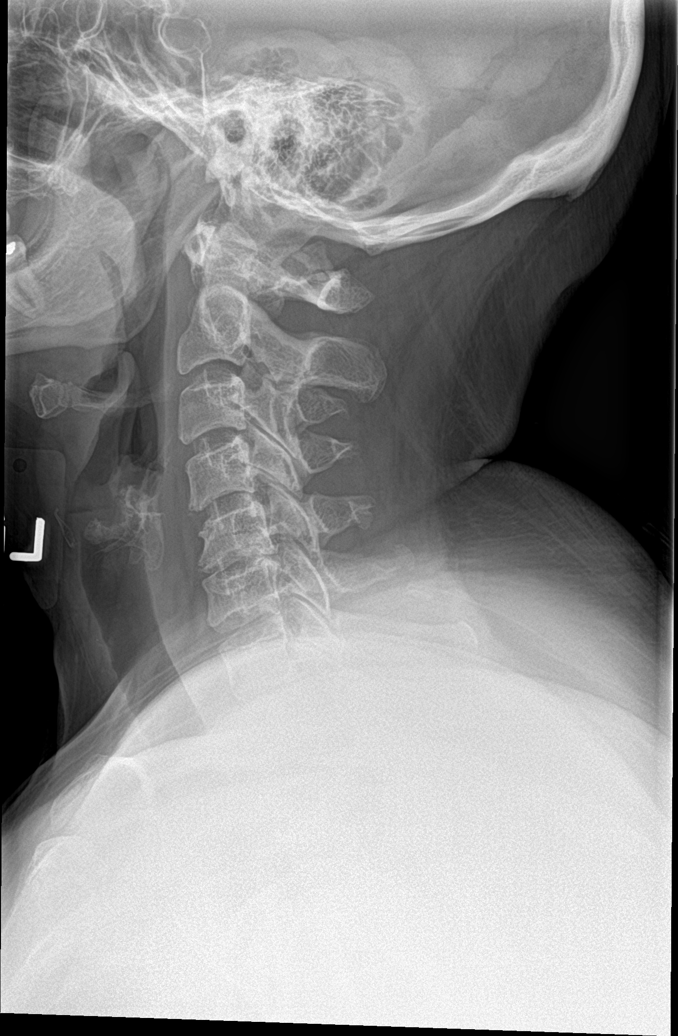

[c-spine obl (1 of 2)]
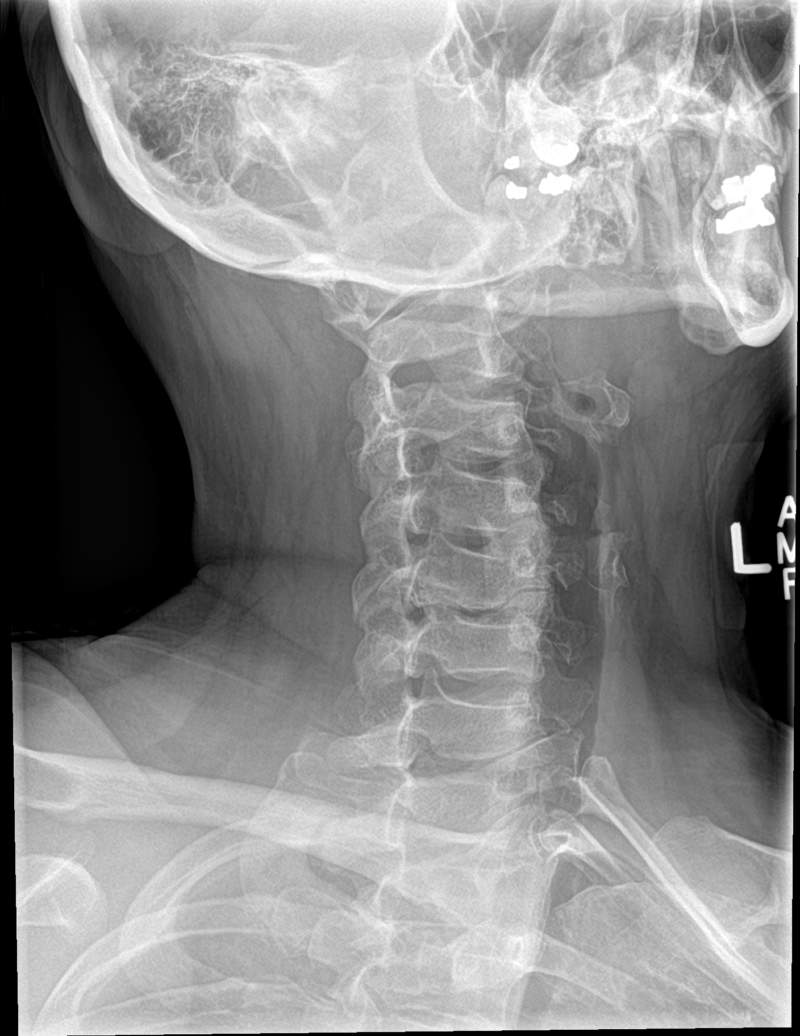

[c-spine obl (2 of 2)]
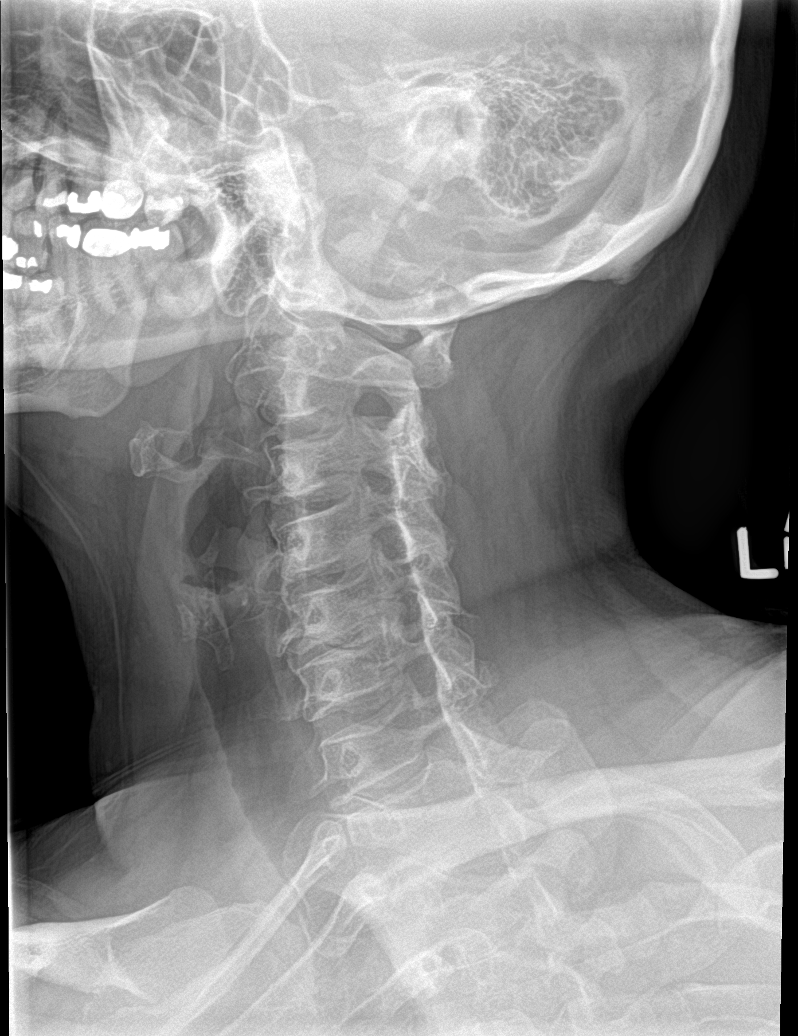

[c-spine ap]
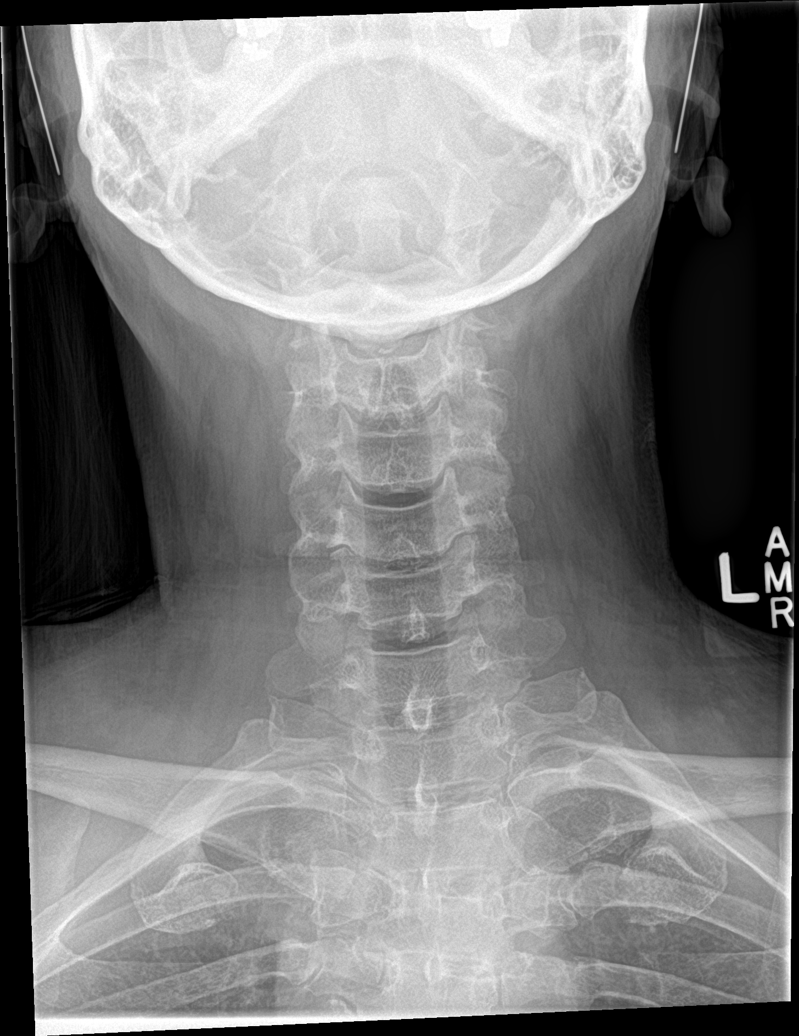

[c-spine open mouth]
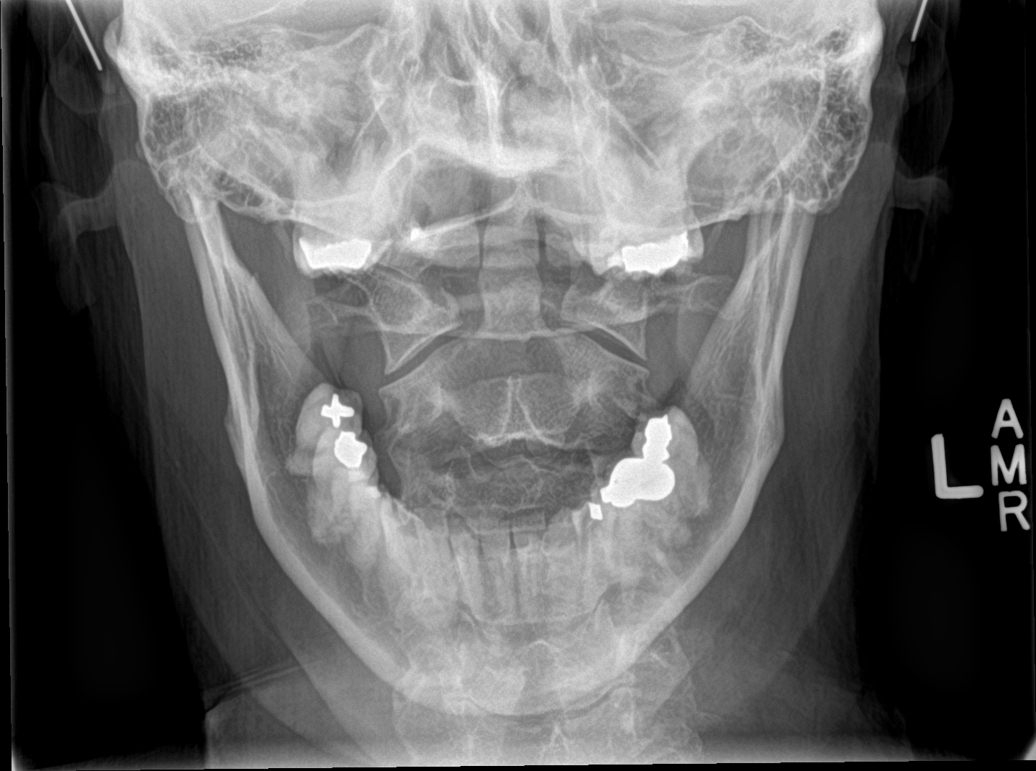

[5 of 5 positions shown; findings below may reference images not displayed]

FINDINGS: No fracture or spondylolisthesis is noted. Moderate degenerative
disc disease is noted at C5-6. Mild bilateral neural foraminal
stenosis is noted secondary to uncovertebral spurring at C5-6.
Remaining disc spaces appear intact. No prevertebral soft tissue
swelling is noted.
IMPRESSION: Moderate degenerative disc disease is noted at C5-6 with bilateral
neural foraminal stenosis at this level secondary to uncovertebral
spurring. No acute abnormality is noted in the cervical spine.

## 2019-05-25 ENCOUNTER — Other Ambulatory Visit: Payer: Self-pay | Admitting: Internal Medicine

## 2019-05-25 NOTE — Telephone Encounter (Signed)
Patient will need an appointment for further refills, Thanks !  Patient was last seen in 2019.

## 2019-05-25 NOTE — Telephone Encounter (Signed)
Attempted to schedule.  LMOV to call office.  ° °

## 2019-06-05 NOTE — Telephone Encounter (Signed)
Attempted to schedule no ans no vm  

## 2019-06-11 NOTE — Telephone Encounter (Signed)
Attempted to schedule no ans no vm  

## 2019-06-13 ENCOUNTER — Encounter: Payer: Self-pay | Admitting: Internal Medicine

## 2019-06-18 ENCOUNTER — Other Ambulatory Visit: Payer: Self-pay | Admitting: Family Medicine

## 2019-06-18 ENCOUNTER — Other Ambulatory Visit (HOSPITAL_COMMUNITY): Payer: Self-pay | Admitting: Family Medicine

## 2019-06-18 DIAGNOSIS — N200 Calculus of kidney: Secondary | ICD-10-CM

## 2019-06-21 NOTE — Telephone Encounter (Signed)
LVM schedule overdue fu

## 2019-06-25 ENCOUNTER — Other Ambulatory Visit: Payer: Self-pay

## 2019-06-25 ENCOUNTER — Ambulatory Visit (HOSPITAL_COMMUNITY)
Admission: RE | Admit: 2019-06-25 | Discharge: 2019-06-25 | Disposition: A | Discharge status: Home / Self Care | Source: Ambulatory Visit | Attending: Family Medicine | Admitting: Family Medicine

## 2019-06-25 DIAGNOSIS — N200 Calculus of kidney: Secondary | ICD-10-CM

## 2019-07-03 ENCOUNTER — Other Ambulatory Visit (HOSPITAL_COMMUNITY): Payer: Self-pay | Admitting: Family Medicine

## 2019-07-03 DIAGNOSIS — Z1231 Encounter for screening mammogram for malignant neoplasm of breast: Secondary | ICD-10-CM

## 2019-07-06 ENCOUNTER — Other Ambulatory Visit: Payer: Self-pay

## 2019-07-06 ENCOUNTER — Ambulatory Visit (INDEPENDENT_AMBULATORY_CARE_PROVIDER_SITE_OTHER): Admitting: Internal Medicine

## 2019-07-06 ENCOUNTER — Encounter: Payer: Self-pay | Admitting: Internal Medicine

## 2019-07-06 VITALS — BP 134/78 | HR 67 | Ht 66.0 in | Wt 183.0 lb

## 2019-07-06 DIAGNOSIS — Z79899 Other long term (current) drug therapy: Secondary | ICD-10-CM

## 2019-07-06 DIAGNOSIS — R0609 Other forms of dyspnea: Secondary | ICD-10-CM

## 2019-07-06 DIAGNOSIS — I2510 Atherosclerotic heart disease of native coronary artery without angina pectoris: Secondary | ICD-10-CM | POA: Diagnosis not present

## 2019-07-06 DIAGNOSIS — R0600 Dyspnea, unspecified: Secondary | ICD-10-CM

## 2019-07-06 DIAGNOSIS — R0789 Other chest pain: Secondary | ICD-10-CM

## 2019-07-06 DIAGNOSIS — E785 Hyperlipidemia, unspecified: Secondary | ICD-10-CM | POA: Diagnosis not present

## 2019-07-06 DIAGNOSIS — I10 Essential (primary) hypertension: Secondary | ICD-10-CM

## 2019-07-06 DIAGNOSIS — I2584 Coronary atherosclerosis due to calcified coronary lesion: Secondary | ICD-10-CM

## 2019-07-06 MED ORDER — ATORVASTATIN CALCIUM 20 MG PO TABS
ORAL_TABLET | ORAL | 3 refills | Status: DC
Start: 2019-07-06 — End: 2019-08-22

## 2019-07-06 NOTE — Progress Notes (Signed)
Follow-up Outpatient Visit Date: 07/06/2019  Primary Care Provider: Christ Kick, [place] [address]  Chief Complaint: Cardiac risk  HPI:  Carolyn Patrick is a 59 y.o. female with history of hyperlipidemia and nephrolithiasis, who presents for follow-up of shortness of breath.  Today, Ms. Stanko reports that she is feeling well.  I last saw her in 05/2017 for evaluation of atypical chest pain and shortness of breath.  Work-up was unrevealing at that time and we recommended addition of low-dose atorvastatin, given her history of coronary artery calcification.  She did not tolerate atorvastatin 20 mg daily well due to myalgias and ultimately stopped the medication.  She reports that her brother recently passed away from heart failure in the setting of severe coronary artery disease.  She has several family members with premature ASCVD, and she would like to do whatever possible to help lower her cardiovascular risk.  Due to this, she recently restarted atorvastatin 10 mg daily and is tolerating this well.  Ms. Hagemeister continues to have intermittent shortness of breath, which she describes as a "catch" in her breathing.  She also endorses occasional central and right-sided chest pain, most often when she first lies down at night.  It resolves on its own within a few minutes.  She does not have any exertional chest pain.  She also denies palpitations, lightheadedness, and edema.  Her home blood pressures are typically 130/80.  --------------------------------------------------------------------------------------------------  Cardiovascular History & Procedures: Cardiovascular Problems:  Shortness of breath  Coronary artery calcification  Risk Factors:  Hyperlipidemia, obesity, and family history  Cath/PCI:  None  CV Surgery:  None  EP Procedures and Devices:  None  Non-Invasive Evaluation(s):  Limited TTE with Definity (05/04/17): Normal LV size with  mild LVH.  LVEF 50-55% with normal wall motion.  ETT (04/25/17): Good exercise capacity without significant ST/T changes or arrhythmias.  Exercised 13:30 min (17.5 METS) achieving 166 bpm (100% MPHR).  TTE (04/25/17): Normal LV size and wall thicknes.  LVEF 45-50% (technically difficult study).  Normal RV size and function.  No significant valvular abnormalities.  Coronary calcium score: 14 (80th percentile for age/gender) in 2016, 3 in 2010.  Recent CV Pertinent Labs: Lab Results  Component Value Date   CHOL 242 (H) 10/31/2017   HDL 45 10/31/2017   LDLCALC 150 (H) 10/31/2017   TRIG 235 (H) 10/31/2017   CHOLHDL 5.4 (H) 10/31/2017   K 3.8 01/14/2015   BUN 11 01/14/2015   CREATININE 1.26 (H) 01/14/2015    Past medical and surgical history were reviewed and updated in EPIC.  Current Meds  Medication Sig  . atorvastatin (LIPITOR) 10 MG tablet Take 1 tablet (10 mg total) by mouth daily.  . hydrochlorothiazide (HYDRODIURIL) 25 MG tablet Take 25 mg by mouth daily.     Allergies: Patient has no known allergies.  Social History   Tobacco Use  . Smoking status: Never Smoker  . Smokeless tobacco: Never Used  Substance Use Topics  . Alcohol use: Yes    Comment: few drinks per year  . Drug use: No    Family History  Problem Relation Age of Onset  . Heart disease Mother        Heart valve  . Stroke Mother   . Coronary artery disease Mother 38  . Heart disease Father   . Heart attack Father 53  . Heart failure Brother   . Coronary artery disease Brother 22  . Heart failure Brother   .  Coronary artery disease Brother 55       stents  . Heart disease Maternal Grandmother   . Heart attack Paternal Grandfather 61    Review of Systems: A 12-system review of systems was performed and was negative except as noted in the HPI.  --------------------------------------------------------------------------------------------------  Physical Exam: BP 134/78 (BP Location: Left Arm,  Patient Position: Sitting, Cuff Size: Normal)   Pulse 67   Ht 5\' 6"  (1.676 m)   Wt 183 lb (83 kg)   SpO2 96%   BMI 29.54 kg/m   General: NAD. Neck: No JVD or HJR.  No carotid bruit. Lungs: Clear to auscultation without wheezes or crackles. Heart: Regular rate and rhythm without murmurs, rubs, or gallops. Abdomen: Soft, nontender, nondistended without hepatosplenomegaly. Extremities: No lower extremity edema.  2+ radial and pedal pulses bilaterally.  EKG: Normal sinus rhythm without abnormality.  Lab Results  Component Value Date   WBC 10.5 01/14/2015   HGB 10.0 (L) 01/14/2015   HCT 30.3 (L) 01/14/2015   MCV 87.8 01/14/2015   PLT 248 01/14/2015    Lab Results  Component Value Date   NA 137 01/14/2015   K 3.8 01/14/2015   CL 105 01/14/2015   CO2 26 01/14/2015   BUN 11 01/14/2015   CREATININE 1.26 (H) 01/14/2015   GLUCOSE 101 (H) 01/14/2015   ALT 14 10/31/2017    Lab Results  Component Value Date   CHOL 242 (H) 10/31/2017   HDL 45 10/31/2017   LDLCALC 150 (H) 10/31/2017   TRIG 235 (H) 10/31/2017   CHOLHDL 5.4 (H) 10/31/2017   Outside labs (06/13/2019): CMP: Sodium 140, potassium 4.1, chloride 101, CO2 23, BUN 9, creatinine 0.8, calcium 9.7, AST 19, ALT 16, alkaline phosphatase 52, total bilirubin 0.6, total protein 7.2, albumin 4.8  Lipid panel: Total cholesterol 181, triglycerides 164, HDL 44, LDL 108  TSH: 1.16  CBC: White blood cell count 7.8, hemoglobin 13.4, hematocrit 39.5, platelets 267  --------------------------------------------------------------------------------------------------  ASSESSMENT AND PLAN: Coronary artery calcification, atypical chest pain, dyspnea on exertion, and hyperlipidemia: Overall, Ms. Tates reports relatively stable symptoms since our last visit 2 years ago.  Prior exercise tolerance test and echocardiogram were unrevealing, though coronary calcium score in 2014 showed a small amount of coronary calcification placing Ms. Cuadrado  in the 80th percentile for age and gender.  We discussed repeat ischemia evaluation but have agreed to defer this given atypical symptoms that have been stable since our last encounter.  We spoke about lifestyle modifications.  We also discussed the role for statin therapy.  She recently had a lipid panel while on atorvastatin 10 mg daily, which showed an LDL of 108.  We discussed increasing atorvastatin versus switching to rosuvastatin.  Given prior myalgias on atorvastatin 20 mg daily, we have agreed to take atorvastatin 20 mg on Mondays, Wednesdays, and Fridays and 10 mg on the other days.  We will arrange for a fasting lipid panel and ALT in about 3 months to reassess response, with goal LDL less than 100.  Hypertension: Blood pressure is upper normal today.  Ms. Sorn should continue to work on sodium restriction and other lifestyle modifications as well as stay on HCTZ 25 mg daily.  Follow-up: Return to clinic in 3 months.  Ladona Ridgel, MD 07/06/2019 2:12 PM

## 2019-07-06 NOTE — Patient Instructions (Addendum)
Medication Instructions:  - Your physician has recommended you make the following change in your medication:   1) Lipitor 10 mg- take 2 tablets (20 mg) by mouth on Mondays/ Wednesdays/ & Fridays  *If you need a refill on your cardiac medications before your next appointment, please call your pharmacy*   Lab Work: - Your physician recommends that you return for FASTING lab work in: 3 months- Lipid/ Liver - You will receive a reminder letter as to when to come for these labs in about 9-10 weeks  If you have labs (blood work) drawn today and your tests are completely normal, you will receive your results only by: Marland Kitchen MyChart Message (if you have MyChart) OR . A paper copy in the mail If you have any lab test that is abnormal or we need to change your treatment, we will call you to review the results.   Testing/Procedures: - none ordered   Follow-Up: At [facility], you and your health needs are our priority.  As part of our continuing mission to provide you with exceptional heart care, we have created designated Provider Care Teams.  These Care Teams include your primary Cardiologist (physician) and Advanced Practice Providers (APPs -  Physician Assistants and Nurse Practitioners) who all work together to provide you with the care you need, when you need it.  We recommend signing up for the patient portal called "MyChart".  Sign up information is provided on this After Visit Summary.  MyChart is used to connect with patients for Virtual Visits (Telemedicine).  Patients are able to view lab/test results, encounter notes, upcoming appointments, etc.  Non-urgent messages can be sent to your provider as well.   To learn more about what you can do with MyChart, go to [email or web address].    Your next appointment:   3 month(s)  The format for your next appointment:   In Person  Provider:    You may see [name], MD or one of the following Advanced Practice Providers on your  designated Care Team:    Nicolasa Ducking, NP  Eula Listen, PA-C  Marisue Ivan, PA-C    Other Instructions - n/a

## 2019-07-07 DIAGNOSIS — R0789 Other chest pain: Secondary | ICD-10-CM | POA: Insufficient documentation

## 2019-07-27 NOTE — Telephone Encounter (Signed)
Unable to reach closing encounter 

## 2019-07-30 ENCOUNTER — Ambulatory Visit (HOSPITAL_COMMUNITY)

## 2019-08-17 ENCOUNTER — Telehealth: Payer: Self-pay | Admitting: Internal Medicine

## 2019-08-17 DIAGNOSIS — Z79899 Other long term (current) drug therapy: Secondary | ICD-10-CM

## 2019-08-17 DIAGNOSIS — E785 Hyperlipidemia, unspecified: Secondary | ICD-10-CM

## 2019-08-17 NOTE — Telephone Encounter (Signed)
I spoke with the patient. She states she took lipitor a couple of years ago and she had muscle pains with this. She resumed it on her own prior to seeing Dr. Okey Dupre as she had a brother die from heart disease. She recently saw Dr. Okey Dupre and her dose of lipitor was increased to 20 mg three times a week.  She states that she has resumed having muscle pains at night that are waking her up and giving her trouble walking. She has stopped her atorvastatin to see if this will help.  I advised the patient it is fine for her to stop atorvastatin for now. She is aware we will review with Dr. Okey Dupre to see if he would like to switch her to something else. The only other thing she has taken per her report is lovastatin. She did not remember having a reaction to this. I advised this may not have been controlling her numbers well enough which prompted the switch to atorvastatin.  She is aware she will receive a call back next week.  She voices understanding of the above and is agreeable.

## 2019-08-17 NOTE — Telephone Encounter (Signed)
Pt c/o medication issue:  1. Name of Medication: Lipitor  2. How are you currently taking this medication (dosage and times per day)? 2 different amounts , 10 and 20 mg  3. Are you having a reaction (difficulty breathing--STAT)? Patient stopped taking 1 week ago.   4. What is your medication issue? Bilateral legs hurting.

## 2019-08-19 NOTE — Telephone Encounter (Signed)
If symptoms improve with statin holiday, I suggest we try rosuvastatin 5 mg daily instead of atorvastatin with lipid panel and LFT's in ~3 months.  [name], MD [facility]

## 2019-08-21 NOTE — Telephone Encounter (Signed)
No answer after several rings. Left message to call back.

## 2019-08-22 MED ORDER — ROSUVASTATIN CALCIUM 5 MG PO TABS
5.0000 mg | ORAL_TABLET | Freq: Every day | ORAL | 1 refills | Status: DC
Start: 2019-08-22 — End: 2020-08-06

## 2019-08-22 NOTE — Telephone Encounter (Signed)
Patient agreeable to switch from atorvastatin to rosuvastatin. She has already been off the atorvastatin for about 2 weeks and symptoms have improved. She is currently in [place] visiting her sick brother. She will plan to make the change when she returns home. She lives in [place] and requests to go to a local [facility] for the repeat labs. Lab slips mailed to patient.  She is aware to do in 3 months after starting rosuvastatin and when she is fasting.

## 2019-09-20 ENCOUNTER — Telehealth: Payer: Self-pay | Admitting: *Deleted

## 2019-09-20 DIAGNOSIS — Z79899 Other long term (current) drug therapy: Secondary | ICD-10-CM

## 2019-09-20 DIAGNOSIS — E785 Hyperlipidemia, unspecified: Secondary | ICD-10-CM

## 2019-09-20 NOTE — Telephone Encounter (Signed)
-----   Message from Jefferey Pica, RN sent at 07/11/2019  8:04 AM EDT ----- Per checkout 07/06/19- needs a repeat lipid/ liver (around last August) for Dr. Okey Dupre- mail a reminder letter.

## 2019-09-20 NOTE — Telephone Encounter (Signed)
See 08/22/19 phone note. No new orders placed at this time.

## 2019-10-08 ENCOUNTER — Ambulatory Visit: Admitting: Physician Assistant

## 2019-11-02 ENCOUNTER — Telehealth: Payer: Self-pay | Admitting: Internal Medicine

## 2019-11-02 NOTE — Telephone Encounter (Signed)
Patient wants to have baseline labs before starting cholesterol med.

## 2019-11-02 NOTE — Telephone Encounter (Signed)
No answer. Left message to call back.   

## 2019-11-05 NOTE — Telephone Encounter (Signed)
No answer. Left message to call back.   

## 2019-11-08 NOTE — Telephone Encounter (Signed)
Spoke to patient. She's been gone [place] for most of the summer and recently had sick brother and a brother die. She has misplaced her Crestor and just found it the other day.  She needed the lab slips mailed to her to get lab work that was due in August. She wants to see what it is now prior to taking Crestor. Lab slips mailed to her.

## 2019-11-16 ENCOUNTER — Other Ambulatory Visit: Payer: Self-pay | Admitting: Internal Medicine

## 2019-11-17 LAB — HEPATIC FUNCTION PANEL
ALT: 13 IU/L (ref 0–32)
AST: 16 IU/L (ref 0–40)
Albumin: 4.8 g/dL (ref 3.8–4.9)
Alkaline Phosphatase: 56 IU/L (ref 44–121)
Bilirubin Total: 0.6 mg/dL (ref 0.0–1.2)
Bilirubin, Direct: 0.11 mg/dL (ref 0.00–0.40)
Total Protein: 7.4 g/dL (ref 6.0–8.5)

## 2019-11-17 LAB — SPECIMEN STATUS REPORT

## 2019-11-17 LAB — LIPID PANEL W/O CHOL/HDL RATIO
Cholesterol, Total: 253 mg/dL — ABNORMAL HIGH (ref 100–199)
HDL: 42 mg/dL (ref >39)
LDL Chol Calc (NIH): 166 mg/dL — ABNORMAL HIGH (ref 0–99)
Triglycerides: 238 mg/dL — ABNORMAL HIGH (ref 0–149)
VLDL Cholesterol Cal: 45 mg/dL — ABNORMAL HIGH (ref 5–40)

## 2019-11-26 ENCOUNTER — Telehealth: Payer: Self-pay | Admitting: *Deleted

## 2019-11-26 DIAGNOSIS — I2510 Atherosclerotic heart disease of native coronary artery without angina pectoris: Secondary | ICD-10-CM

## 2019-11-26 DIAGNOSIS — Z79899 Other long term (current) drug therapy: Secondary | ICD-10-CM

## 2019-11-26 DIAGNOSIS — E785 Hyperlipidemia, unspecified: Secondary | ICD-10-CM

## 2019-11-26 NOTE — Telephone Encounter (Signed)
-----   Message from [name], MD sent at 11/26/2019  7:25 AM EDT ----- Please let Ms. Carolyn Patrick know that her cholesterol is quite elevated with an LDL of 166 and triglycerides of 238.  Liver function tests are normal.  I recommend that she begin taking rosuvastatin as previously prescribed with follow-up lipid panel and ALT in ~3 months.

## 2019-11-26 NOTE — Telephone Encounter (Signed)
Results released to My Chart. No answer. Left message to call back.   

## 2019-11-28 NOTE — Telephone Encounter (Signed)
Patient called back. She verbalized understanding of the results and is willing to start Crestor 5 mg daily. She already has the medication at home. She asked I mail her the orders for the lab work in 3 months. She is also due for follow up but requested to be scheduled in 3 months so that she can have her lab results at that time. Lab slips mailed.

## 2020-03-17 ENCOUNTER — Other Ambulatory Visit: Payer: Self-pay | Admitting: Internal Medicine

## 2020-03-18 LAB — LIPID PANEL
Chol/HDL Ratio: 4.6 ratio — ABNORMAL HIGH (ref 0.0–4.4)
Cholesterol, Total: 219 mg/dL — ABNORMAL HIGH (ref 100–199)
HDL: 48 mg/dL (ref >39)
LDL Chol Calc (NIH): 120 mg/dL — ABNORMAL HIGH (ref 0–99)
Triglycerides: 292 mg/dL — ABNORMAL HIGH (ref 0–149)
VLDL Cholesterol Cal: 51 mg/dL — ABNORMAL HIGH (ref 5–40)

## 2020-03-18 LAB — HEPATIC FUNCTION PANEL
ALT: 17 IU/L (ref 0–32)
AST: 13 IU/L (ref 0–40)
Albumin: 5 g/dL — ABNORMAL HIGH (ref 3.8–4.9)
Alkaline Phosphatase: 55 IU/L (ref 44–121)
Bilirubin Total: 0.6 mg/dL (ref 0.0–1.2)
Bilirubin, Direct: 0.12 mg/dL (ref 0.00–0.40)
Total Protein: 7.5 g/dL (ref 6.0–8.5)

## 2020-03-19 ENCOUNTER — Encounter: Payer: Self-pay | Admitting: Internal Medicine

## 2020-03-19 ENCOUNTER — Other Ambulatory Visit: Payer: Self-pay

## 2020-03-19 ENCOUNTER — Ambulatory Visit (INDEPENDENT_AMBULATORY_CARE_PROVIDER_SITE_OTHER): Admitting: Internal Medicine

## 2020-03-19 VITALS — BP 144/90 | HR 58 | Ht 66.0 in | Wt 187.4 lb

## 2020-03-19 DIAGNOSIS — I2510 Atherosclerotic heart disease of native coronary artery without angina pectoris: Secondary | ICD-10-CM | POA: Diagnosis not present

## 2020-03-19 DIAGNOSIS — I2584 Coronary atherosclerosis due to calcified coronary lesion: Secondary | ICD-10-CM

## 2020-03-19 DIAGNOSIS — I10 Essential (primary) hypertension: Secondary | ICD-10-CM

## 2020-03-19 DIAGNOSIS — E782 Mixed hyperlipidemia: Secondary | ICD-10-CM

## 2020-03-19 DIAGNOSIS — E785 Hyperlipidemia, unspecified: Secondary | ICD-10-CM

## 2020-03-19 NOTE — Progress Notes (Signed)
Follow-up Outpatient Visit Date: 03/19/2020  Primary Care Provider: Anselmo Pickler, MD [address] [place] [address]  Chief Complaint: Follow-up elevated cholesterol  HPI:  Carolyn Patrick is a 60 y.o. female with history of coronary artery calcification, hyperlipidemia, nephrolithiasis, who presents for follow-up of shortness of breath.  I last saw her in late May, 2021, at which time she complained of continued shortness of breath with a "catch" in her breath.  She also mentioned intermittent central and right-sided chest pain, typically when lying down at night.  We agreed to escalate her atorvastatin to 20 mg on Mondays, Wednesdays, and Fridays to improve her lipid control.  However, she did not tolerate this due to myalgias prompting [place] to switch to rosuvastatin 5 mg daily.  She had a lipid panel performed through her PCP last week which was notable for triglycerides of 292 and LDL of 120.  She notes dietary indiscretion in the week leading up to her lipid panel.  Today, [name] reports she feels about the same as at prior visits.  She still notices a "catch" in her breathing, which is unchanged.  She does not have any exertional dyspnea.  She also notes occasional vague chest discomfort when lying down.  She does not have any exertional chest pain.  She denies lightheadedness and dizziness.  On further questioning, she thinks that she may have palpitations at times though she is "not sure if they are real.".  Home blood pressure has typically been in the 120s over 80s.  She is tolerating rosuvastatin 5 mg daily well.  --------------------------------------------------------------------------------------------------  Cardiovascular History & Procedures: Cardiovascular Problems:  Shortness of breath  Coronary artery calcification  Risk Factors:  Hyperlipidemia, obesity, and family history  Cath/PCI:  None  CV Surgery:  None  EP Procedures and  Devices:  None  Non-Invasive Evaluation(s):  Limited TTE with Definity (05/04/17): Normal LV size with mild LVH. LVEF 50-55% with normal wall motion.  ETT (04/25/17): Good exercise capacity without significant ST/T changes or arrhythmias. Exercised 13:30 min (17.5 METS) achieving 166 bpm (100% MPHR).  TTE (04/25/17): Normal LV size and wall thicknes. LVEF 45-50% (technically difficult study). Normal RV size and function. No significant valvular abnormalities.  Coronary calcium score:14 (80th percentile for age/gender) in 2016, 3 in 2010.   Recent CV Pertinent Labs: Lab Results  Component Value Date   CHOL 253 (H) 11/16/2019   HDL 42 11/16/2019   LDLCALC 166 (H) 11/16/2019   TRIG 238 (H) 11/16/2019   CHOLHDL 5.4 (H) 10/31/2017   K 3.8 01/14/2015   BUN 11 01/14/2015   CREATININE 1.26 (H) 01/14/2015    Past medical and surgical history were reviewed and updated in EPIC.  Current Meds  Medication Sig  . hydrochlorothiazide (HYDRODIURIL) 25 MG tablet Take 25 mg by mouth daily.   . rosuvastatin (CRESTOR) 5 MG tablet Take 1 tablet (5 mg total) by mouth daily.    Allergies: Patient has no known allergies.  Social History   Tobacco Use  . Smoking status: Never Smoker  . Smokeless tobacco: Never Used  Vaping Use  . Vaping Use: Never used  Substance Use Topics  . Alcohol use: Yes    Comment: few drinks per year  . Drug use: No    Family History  Problem Relation Age of Onset  . Heart disease Mother        Heart valve  . Stroke Mother   . Coronary artery disease Mother 6  .  Heart disease Father   . Heart attack Father 56  . Heart failure Brother   . Coronary artery disease Brother 31  . Heart attack Brother   . Heart failure Brother   . Coronary artery disease Brother 55       stents  . Heart attack Brother   . Heart disease Maternal Grandmother   . Heart attack Paternal Grandfather 63    Review of Systems: A 12-system review of systems was performed  and was negative except as noted in the HPI.  --------------------------------------------------------------------------------------------------  Physical Exam: BP (!) 144/90 (BP Location: Left Arm, Patient Position: Sitting, Cuff Size: Normal)   Pulse (!) 58   Ht 5\' 6"  (1.676 m)   Wt 187 lb 6 oz (85 kg)   SpO2 98%   BMI 30.24 kg/m   Repeat blood pressure: 144/78  General:  NAD. Neck: No JVD or HJR. Lungs: Clear to auscultation bilaterally without wheezes or crackles. Heart: Bradycardic but regular without murmurs, rubs, or gallops. Abdomen: Soft, nontender, nondistended. Extremities: No lower extremity edema.  EKG: Sinus bradycardia (heart rate 58 bpm).  Otherwise, no significant abnormality.  Lab Results  Component Value Date   WBC 10.5 01/14/2015   HGB 10.0 (L) 01/14/2015   HCT 30.3 (L) 01/14/2015   MCV 87.8 01/14/2015   PLT 248 01/14/2015    Lab Results  Component Value Date   NA 137 01/14/2015   K 3.8 01/14/2015   CL 105 01/14/2015   CO2 26 01/14/2015   BUN 11 01/14/2015   CREATININE 1.26 (H) 01/14/2015   GLUCOSE 101 (H) 01/14/2015   ALT 13 11/16/2019    Lab Results  Component Value Date   CHOL 253 (H) 11/16/2019   HDL 42 11/16/2019   LDLCALC 166 (H) 11/16/2019   TRIG 238 (H) 11/16/2019   CHOLHDL 5.4 (H) 10/31/2017   Outside labs: TC 219, TG 292, HDL 48, LDL 120 --------------------------------------------------------------------------------------------------  ASSESSMENT AND PLAN: Mixed hyperlipidemia: Lipids have improved with low-dose rosuvastatin, though given history of coronary artery calcification (80th percentile for age matched controls), I would favor more aggressive lipid control with target LDL less than 70.  We discussed escalation of rosuvastatin versus lifestyle modifications and have agreed to the latter.  We will plan to repeat a lipid panel and ALT in 3 months.  Rosuvastatin will need to be increased if LDL remains above  goal.  Hypertension: Blood pressure mildly elevated today but typically better at home.  I encouraged Ms. Buser to continue monitoring her blood pressure and to minimize her sodium intake.  We will continue her current dose of HCTZ.  Coronary artery calcification: No symptoms reported to suggest worsening coronary insufficiency.  Coronary artery calcification previously noted on calcium score CT.  GXT in 2019 was low risk without ischemic EKG changes.  Continue medical therapy as outlined above.  Follow-up: Return to clinic in 4 months.  2020, MD 03/19/2020 4:32 PM

## 2020-03-19 NOTE — Patient Instructions (Signed)
Medication Instructions:  Your physician recommends that you continue on your current medications as directed. Please refer to the Current Medication list given to you today.  *If you need a refill on your cardiac medications before your next appointment, please call your pharmacy*   Lab Work: Your physician recommends that you return for a FASTING lipid profile and alt in 3 months.  Please have your labs drawn at the [facility] medical mall. You do not need an appt. Lab hours are Mon-Fri 7am-6pm.  If you have labs (blood work) drawn today and your tests are completely normal, you will receive your results only by: Marland Kitchen MyChart Message (if you have MyChart) OR . A paper copy in the mail If you have any lab test that is abnormal or we need to change your treatment, we will call you to review the results.   Testing/Procedures: None ordered   Follow-Up: At [facility], you and your health needs are our priority.  As part of our continuing mission to provide you with exceptional heart care, we have created designated Provider Care Teams.  These Care Teams include your primary Cardiologist (physician) and Advanced Practice Providers (APPs -  Physician Assistants and Nurse Practitioners) who all work together to provide you with the care you need, when you need it.  We recommend signing up for the patient portal called "MyChart".  Sign up information is provided on this After Visit Summary.  MyChart is used to connect with patients for Virtual Visits (Telemedicine).  Patients are able to view lab/test results, encounter notes, upcoming appointments, etc.  Non-urgent messages can be sent to your provider as well.   To learn more about what you can do with MyChart, go to [email or web address].    Your next appointment:   4 month(s)  The format for your next appointment:   In Person  Provider:   You may see [name], MD or one of the following Advanced Practice Providers on your  designated Care Team:    Nicolasa Ducking, NP  Eula Listen, PA-C  Marisue Ivan, PA-C  Cadence Fransico Michael, [place]  Gillian Shields, NP    Other Instructions Your physician discussed the importance of a health diet and regular exercise. Dr. Okey Dupre recommended that you start or continue a regular exercise program for good health.

## 2020-03-20 ENCOUNTER — Encounter: Payer: Self-pay | Admitting: Internal Medicine

## 2020-03-20 DIAGNOSIS — I10 Essential (primary) hypertension: Secondary | ICD-10-CM | POA: Insufficient documentation

## 2020-08-04 NOTE — Progress Notes (Signed)
Follow-up Outpatient Visit Date: 08/06/2020  Primary Care Provider: Barron Alvine, MD [address] [place] [address]  Chief Complaint: Follow-up coronary artery calcification and hyperlipidemia  HPI:  Carolyn Patrick is a 60 y.o. female with history of coronary artery calcification, hyperlipidemia, nephrolithiasis, who presents for follow-up of dyspnea and hyperlipidemia.  I last saw her in February, at which time she continued to notice an occasional "catch" in her breathing.  LDL was still above 70 (our goal given CAC - 80th percentile).  We agreed to work on lifestyle modifications with plans for repeat lipid panel in ~3 months.  She has been on rosuvastatin 5 mg daily, which she is tolerating well.  Ms. Bitterman is concerned about intermittent cramping in her muscles, especially when she has been working outside.  She wonders if she could be dehydrated.  She also feels like the "catch" in her breathing is worse when she is dehydrated.  She is scheduled to see a neurologist to see if there is a possible neurologic cause for her breathing issues and twitching in her left arm.  She denies exertional dyspnea as well as chest pain and palpitations.  Home blood pressures are typically around 1 20-1 30 systolic but was a little bit higher yesterday.  She does not recall why she was originally started on HCTZ but does not believe it was due to hypertension.  She was advised to continue this after having kidney stones by her urologist.  --------------------------------------------------------------------------------------------------  Cardiovascular History & Procedures: Cardiovascular Problems: Shortness of breath Coronary artery calcification   Risk Factors: Hyperlipidemia, obesity, and family history   Cath/PCI: None   CV Surgery: None   EP Procedures and Devices: None   Non-Invasive Evaluation(s): Limited TTE with Definity (05/04/17): Normal LV size with mild LVH.  LVEF  50-55% with normal wall motion. ETT (04/25/17): Good exercise capacity without significant ST/T changes or arrhythmias.  Exercised 13:30 min (17.5 METS) achieving 166 bpm (100% MPHR). TTE (04/25/17): Normal LV size and wall thicknes.  LVEF 45-50% (technically difficult study).  Normal RV size and function.  No significant valvular abnormalities. Coronary calcium score: 14 (80th percentile for age/gender) in 2016, 3 in 2010.  Recent CV Pertinent Labs: Lab Results  Component Value Date   CHOL 219 (H) 03/17/2020   HDL 48 03/17/2020   LDLCALC 120 (H) 03/17/2020   TRIG 292 (H) 03/17/2020   CHOLHDL 4.6 (H) 03/17/2020   K 3.8 01/14/2015   BUN 11 01/14/2015   CREATININE 1.26 (H) 01/14/2015    Past medical and surgical history were reviewed and updated in EPIC.  Current Meds  Medication Sig   ergocalciferol (VITAMIN D2) 1.25 MG (50000 UT) capsule Take 1 capsule by mouth once a week.   hydrochlorothiazide (HYDRODIURIL) 25 MG tablet Take 25 mg by mouth daily.    rosuvastatin (CRESTOR) 5 MG tablet Take 1 tablet (5 mg total) by mouth daily.    Allergies: Patient has no known allergies.  Social History   Tobacco Use   Smoking status: Never   Smokeless tobacco: Never  Vaping Use   Vaping Use: Never used  Substance Use Topics   Alcohol use: Yes    Comment: few drinks per year   Drug use: No    Family History  Problem Relation Age of Onset   Heart disease Mother        Heart valve   Stroke Mother    Coronary artery disease Mother 58   Heart  disease Father    Heart attack Father 60   Heart failure Brother    Coronary artery disease Brother 51   Heart attack Brother    Heart failure Brother    Coronary artery disease Brother 55       stents   Heart attack Brother    Heart disease Maternal Grandmother    Heart attack Paternal Grandfather 52    Review of Systems: A 12-system review of systems was performed and was negative except as noted in the  HPI.  --------------------------------------------------------------------------------------------------  Physical Exam: BP 140/90 (BP Location: Left Arm, Patient Position: Sitting, Cuff Size: Large)   Pulse 72   Ht 5\' 6"  (1.676 m)   Wt 184 lb (83.5 kg)   SpO2 97%   BMI 29.70 kg/m   General:  NAD. Neck: No JVD or HJR. Lungs: Clear to auscultation bilaterally without wheezes or crackles. Heart: Regular rate and rhythm without murmurs, rubs, or gallops. Abdomen: Soft, nontender, nondistended. Extremities: No lower extremity edema.   Lab Results  Component Value Date   WBC 10.5 01/14/2015   HGB 10.0 (L) 01/14/2015   HCT 30.3 (L) 01/14/2015   MCV 87.8 01/14/2015   PLT 248 01/14/2015    Lab Results  Component Value Date   NA 137 01/14/2015   K 3.8 01/14/2015   CL 105 01/14/2015   CO2 26 01/14/2015   BUN 11 01/14/2015   CREATININE 1.26 (H) 01/14/2015   GLUCOSE 101 (H) 01/14/2015   ALT 17 03/17/2020    Lab Results  Component Value Date   CHOL 219 (H) 03/17/2020   HDL 48 03/17/2020   LDLCALC 120 (H) 03/17/2020   TRIG 292 (H) 03/17/2020   CHOLHDL 4.6 (H) 03/17/2020    --------------------------------------------------------------------------------------------------  ASSESSMENT AND PLAN: Coronary artery calcification and hyperlipidemia: Ms. Pellicane is tolerating low-dose rosuvastatin well.  Recent lipid panel through [facility] on 5/18 showed improved LDL (120->98).  Ms. Schoonmaker does not believe that her intermittent muscle cramps are related to rosuvastatin.  We discussed dose escalation to target an LDL less than 70 in the setting of her coronary artery calcification but have agreed to defer this in favor of continued lifestyle modifications.  Hypertension: Blood pressure borderline elevated again today.  Ms. Cespedes notes that she was not started on HCTZ for elevated blood pressure.  Given concern for dehydration, we will discontinue HCTZ.  We may need to add another agent if  her blood pressure remains consistently above 140/90.  I have Ms. Heizer to contact Ladona Ridgel in about a month to let us know how her muscle cramps and blood pressure are doing.  Follow-up: Return to clinic in 6 months.  [place], MD 08/06/2020 3:03 PM

## 2020-08-06 ENCOUNTER — Encounter: Payer: Self-pay | Admitting: Internal Medicine

## 2020-08-06 ENCOUNTER — Ambulatory Visit (INDEPENDENT_AMBULATORY_CARE_PROVIDER_SITE_OTHER): Admitting: Internal Medicine

## 2020-08-06 ENCOUNTER — Other Ambulatory Visit: Payer: Self-pay

## 2020-08-06 VITALS — BP 140/90 | HR 72 | Ht 66.0 in | Wt 184.0 lb

## 2020-08-06 DIAGNOSIS — E782 Mixed hyperlipidemia: Secondary | ICD-10-CM | POA: Diagnosis not present

## 2020-08-06 DIAGNOSIS — I10 Essential (primary) hypertension: Secondary | ICD-10-CM | POA: Diagnosis not present

## 2020-08-06 DIAGNOSIS — I2510 Atherosclerotic heart disease of native coronary artery without angina pectoris: Secondary | ICD-10-CM

## 2020-08-06 DIAGNOSIS — I2584 Coronary atherosclerosis due to calcified coronary lesion: Secondary | ICD-10-CM

## 2020-08-06 MED ORDER — ROSUVASTATIN CALCIUM 5 MG PO TABS: 5.0000 mg | ORAL_TABLET | Freq: Every day | ORAL | 3 refills | Status: AC

## 2020-08-06 NOTE — Patient Instructions (Signed)
Medication Instructions:   Your physician has recommended you make the following change in your medication:   STOP Hydrochlorothiazide (HCTZ)    - Please let us know in 1 month via MyChart if your dehydration has improved  *If you need a refill on your cardiac medications before your next appointment, please call your pharmacy*   Lab Work:  None ordered  Testing/Procedures:  None ordered   Follow-Up: At [facility], you and your health needs are our priority.  As part of our continuing mission to provide you with exceptional heart care, we have created designated Provider Care Teams.  These Care Teams include your primary Cardiologist (physician) and Advanced Practice Providers (APPs -  Physician Assistants and Nurse Practitioners) who all work together to provide you with the care you need, when you need it.  We recommend signing up for the patient portal called "MyChart".  Sign up information is provided on this After Visit Summary.  MyChart is used to connect with patients for Virtual Visits (Telemedicine).  Patients are able to view lab/test results, encounter notes, upcoming appointments, etc.  Non-urgent messages can be sent to your provider as well.   To learn more about what you can do with MyChart, go to [email or web address].    Your next appointment:   6 month(s)  The format for your next appointment:   In Person  Provider:   You may see [name], MD or one of the following Advanced Practice Providers on your designated Care Team:   Nicolasa Ducking, NP Eula Listen, PA-C Marisue Ivan, PA-C Cadence Theodosia, [place] Gillian Shields, NP

## 2020-09-30 ENCOUNTER — Encounter: Payer: Self-pay | Admitting: *Deleted

## 2020-10-01 ENCOUNTER — Encounter: Payer: Self-pay | Admitting: Neurology

## 2020-10-01 ENCOUNTER — Ambulatory Visit (INDEPENDENT_AMBULATORY_CARE_PROVIDER_SITE_OTHER): Admitting: Neurology

## 2020-10-01 VITALS — BP 143/87 | HR 59 | Ht 66.0 in | Wt 185.0 lb

## 2020-10-01 DIAGNOSIS — F951 Chronic motor or vocal tic disorder: Secondary | ICD-10-CM | POA: Diagnosis not present

## 2020-10-01 NOTE — Progress Notes (Signed)
Subjective:    Patient ID: Carolyn Patrick is a 60 y.o. female.  HPI    Huston Foley, MD, PhD [facility] [address], [address] [address] [place], [place] [address]  Dear Dr. Hollice Espy,   I saw your patient, Carolyn Patrick, upon your kind request in my neurologic clinic today for initial consultation of her muscle twitching.  The patient is unaccompanied today.  As you know, Carolyn Patrick is a 60 year old right-handed woman with an underlying medical history of reflux disease, hyperlipidemia, and overweight state, who reports an approximately 8-year history of involuntary movements affecting primarily her shoulder areas bilaterally.  Her previous primary care physician had noticed these and attributed her involuntary movements to stress.  Patient reports that she has a tendency to make throat clearing sounds and sometimes has movements in her abdominal area.  Her family and friends have noticed these, she is not particularly aware of these involuntary movements and is not bothered by them.  She has no family history of Tourette's or tic disorder, grandfather had Parkinson's disease.  She herself did not have any involuntary movements or tics as a child.  She reports that she has noticed these tendencies to twitch her shoulder girdle on either side for the past 8 years.  She believes that her symptoms are mildly progressive but not disturbing to her.  She has not fallen recently.  She has not had a brain scan.  Her previous primary care physician had suggested a trial of antidepressant medication but the patient did not feel the need for this.  She denies any specific stressors.  She is married and lives with her husband.  She is currently not working.  She retired.  She does not drink caffeine in excess, typically 1 cup of coffee in the morning and 1 glass of tea in the evening.  She tries to hydrate well with water.  She denies any sudden onset of one-sided weakness or numbness or  tingling or droopy face or slurring of speech.  She is currently on prescription vitamin D as well as Crestor, otherwise no prescription medications, no supplements.  I reviewed your office note from 08/15/2020 and 06/25/20. She had blood work through your office on 06/25/20 and I reviewed the results: CBC was unremarkable, chemistry panel showed sodium 142, potassium 4.1, BUN 10, creatinine 0.8, glucose 96, calcium 10.1, differential unremarkable.  Lipid profile showed total cholesterol of 187, triglycerides 209, LDL 98, HDL 47.  Vitamin D was on the lower end at 36.2, TSH was 1.2.  Of note, she has had neck and shoulder pain for years.  She had a neck x-ray on 06/22/2016 and I reviewed the results: IMPRESSION: Moderate degenerative disc disease is noted at C5-6 with bilateral neural foraminal stenosis at this level secondary to uncovertebral spurring. No acute abnormality is noted in the cervical spine.    Her Past Medical History Is Significant For: Past Medical History:  Diagnosis Date   GERD (gastroesophageal reflux disease)    Hypercholesteremia    Renal disorder    kidney stones    Her Past Surgical History Is Significant For: Past Surgical History:  Procedure Laterality Date   CYSTOSCOPY W/ URETERAL STENT PLACEMENT Right 01/12/2015   Procedure: CYSTOSCOPY WITH RETROGRADE PYELOGRAM/URETERAL STENT PLACEMENT;  Surgeon: Marcine Matar, MD;  Location: [facility];  Service: Urology;  Laterality: Right;   CYSTOSCOPY W/ URETERAL STENT REMOVAL Right 02/18/2015   Procedure: CYSTOSCOPY WITH RIGHT URETERAL STENT REMOVAL;  Surgeon: Marcine Matar,  MD;  Location: [facility];  Service: Urology;  Laterality: Right;   CYSTOSCOPY/URETEROSCOPY/HOLMIUM LASER Right 02/18/2015   Procedure: CYSTOSCOPY/RIGHT URETEROSCOPY/HOLMIUM LASER LITHOTRIPSY/RIGHT URETEROSCOPIC STONE EXTRACTION;  Surgeon: Marcine Matar, MD;  Location: [facility];  Service: Urology;  Laterality: Right;   HOLMIUM LASER APPLICATION Right  02/18/2015   Procedure: HOLMIUM LASER APPLICATION;  Surgeon: Marcine Matar, MD;  Location: [facility];  Service: Urology;  Laterality: Right;   KNEE SURGERY Right    arthroscopy and meniscal repair    Her Family History Is Significant For: Family History  Problem Relation Age of Onset   Heart disease Mother        Heart valve   Stroke Mother    Coronary artery disease Mother 19   Heart disease Father    Heart attack Father 79   Heart failure Brother    Coronary artery disease Brother 5   Heart attack Brother    Heart failure Brother    Coronary artery disease Brother 17       stents   Heart attack Brother    Heart disease Maternal Grandmother    Heart attack Paternal Grandfather 2    Her Social History Is Significant For: Social History   Socioeconomic History   Marital status: Married    Spouse name: Not on file   Number of children: Not on file   Years of education: Not on file   Highest education level: Not on file  Occupational History   Not on file  Tobacco Use   Smoking status: Never   Smokeless tobacco: Never  Vaping Use   Vaping Use: Never used  Substance and Sexual Activity   Alcohol use: Yes    Comment: few drinks per year   Drug use: No   Sexual activity: Yes    Birth control/protection: None  Other Topics Concern   Not on file  Social History Narrative   Caffeine one cup daily.  Education : college degree, Chief Operating Officer, Work retired.     Social Determinants of Health   Financial Resource Strain: Not on file  Food Insecurity: Not on file  Transportation Needs: Not on file  Physical Activity: Not on file  Stress: Not on file  Social Connections: Not on file    Her Allergies Are:  No Known Allergies:   Her Current Medications Are:  Outpatient Encounter Medications as of 10/01/2020  Medication Sig   ergocalciferol (VITAMIN D2) 1.25 MG (50000 UT) capsule Take 1 capsule by mouth once a week.   hydrochlorothiazide (HYDRODIURIL) 25 MG tablet Take  25 mg by mouth daily.   rosuvastatin (CRESTOR) 5 MG tablet Take 1 tablet (5 mg total) by mouth daily.   No facility-administered encounter medications on file as of 10/01/2020.  :   Review of Systems:  Out of a complete 14 point review of systems, all are reviewed and negative with the exception of these symptoms as listed below:    Review of Systems  Neurological:        Muscle twitching L shoulder, clears throats, pushes on R abdomen helps to clear throat.    Objective:  Neurological Exam  Physical Exam Physical Examination:   Vitals:   10/01/20 1435  BP: (!) 143/87  Pulse: (!) 59   General Examination: The patient is a very pleasant 60 y.o. female in no acute distress. She appears well-developed and well-nourished and well groomed.   HEENT: Normocephalic, atraumatic, pupils are equal, round and reactive to light and accommodation. Extraocular tracking is  good without limitation to gaze excursion or nystagmus noted. Normal smooth pursuit is noted. Hearing is grossly intact. Face is symmetric with normal facial animation and normal facial sensation. Speech is clear with no dysarthria noted. There is no hypophonia.  No facial masking, no facial dyskinesias.  Tongue movements are benign, no involuntary tongue movements.  No carotid bruits.  She has occasional throat clearing sounds.  Neck is supple with full range of passive and active motion.   Chest: Clear to auscultation without wheezing, rhonchi or crackles noted.  Heart: S1+S2+0, regular and normal without murmurs, rubs or gallops noted.   Abdomen: Soft, non-tender and non-distended.  Extremities: There is no pitting edema in the distal lower extremities bilaterally. Pedal pulses are intact.  Skin: Warm and dry without trophic changes noted.   Musculoskeletal: exam reveals no obvious joint deformities.  Neurologically:  Mental status: The patient is awake, alert and oriented in all 4 spheres. Her immediate and remote  memory, attention, language skills and fund of knowledge are appropriate. There is no evidence of aphasia, agnosia, apraxia or anomia. Speech is clear with normal prosody and enunciation. Thought process is linear. Mood is normal and affect is normal.  Cranial nerves II - XII are as described above under HEENT exam. In addition: shoulder shrug is normal with equal shoulder height noted. Motor exam: Normal bulk, strength and tone is noted.  No myoclonus, no choreiform movements, no athetoid movements.  She has intermittent motor tics affecting the shoulder girdle area on either side.  There is no drift, tremor or rebound. Romberg is negative. Reflexes are 1+ throughout. Babinski: Toes are flexor bilaterally. Fine motor skills and coordination: intact with normal finger taps, normal hand movements, normal rapid alternating patting, normal foot taps and normal foot agility.  Cerebellar testing: No dysmetria or intention tremor on finger to nose testing. Heel to shin is unremarkable bilaterally. There is no truncal or gait ataxia.  Sensory exam: intact to light touch, vibration, temperature sense in the upper and lower extremities.  Gait, station and balance: She stands easily. No veering to one side is noted. No leaning to one side is noted. Posture is age-appropriate and stance is narrow based. Gait shows normal stride length and normal pace. No problems turning are noted. Tandem walk is unremarkable.  Assessment and Plan:  Assessment and Plan:  In summary, Carolyn Patrick is a very pleasant 60 y.o.-year old female with an underlying medical history of reflux disease, hyperlipidemia, and overweight state, who who presents for evaluation of her involuntary movements of several years duration, as far as she can recall approximately 8 years.  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  Since her history does not date back to childhood, she does not fulfill criteria for Tourette's  syndrome.  Findings are overall benign, neurological exam otherwise nonfocal.  She is largely reassured in that regard.  Thankfully, she is not particularly impaired in her day-to-day function and she is not always aware of her involuntary movements or sounds.  She has been noted to have makes throat clearing sounds and shoulder twitching movements by her friends and family but she is not keen on trying any symptomatic medications.  She is advised to proceed with some blood work today to rule out any reversible or treatable causes for involuntary movements.  We will also proceed with a brain MRI with and without contrast to rule out a structural cause of her symptoms.  Presentation overall is benign  and she is largely reassured today.  We will proceed with her scan and blood work and call her with the results.  For now, she is advised to follow-up with you as scheduled.  I would be happy to see her back as needed, so long as her test results are benign we will keep her posted by phone call.  She is advised to consider potential treatment down the road in the form of behavioral intervention, this can be quite successful with the help of a psychologist.  If stress and anxiety are triggers, she is advised to talk to about management of stress and anxiety.  Her previous primary care physician had suggested a trial of an antidepressant medication but she did not want to pursue this.  She currently denies any significant depression, stressors in her life or anxiety.  She is agreeable to monitoring her symptoms.  I answered all her questions today and she was in agreement with our plan.  Thank you very much for allowing me to participate in the care of this nice patient. If I can be of any further assistance to you please do not hesitate to call me at [phone].  Sincerely,   Huston FoleySaima Kerrick Miler, MD, PhD

## 2020-10-01 NOTE — Patient Instructions (Signed)
It was nice to meet you today.  I do not believe you have muscle twitching, you likely have tics.  These are involuntary movements that have a repetitive and stereotypic pattern.  Some people have so-called phonic tics which causes them to make an abnormal sound, including coughing or throat clearing.  Your neurological exam otherwise is benign.  Nevertheless, I would like to proceed with some blood work to rule out a reversible or treatable cause of your tics.  We will also do a brain MRI to rule out a structural cause of your symptoms. Most people with mild and nondisabling tics do not have to be treated with any medication to suppress tics.  We can certainly adopt a watchful waiting position, which is most often acceptable when there is no impairment in your day-to-day life from your tics. Behavioral or pharmacologic tic suppression therapy is not always indicated.  If need be, we can consider a referral to psychology, sometimes behavioral therapy can help.  Stress reduction with meditation can also help.  If anxiety is a trigger, anxiety management can be indicated.  For now, we will keep you posted as to your test results by phone call and I recommend that you follow-up routinely with your primary care physician.  I would be happy to see you back as needed.

## 2020-10-02 ENCOUNTER — Telehealth: Payer: Self-pay | Admitting: Neurology

## 2020-10-02 NOTE — Telephone Encounter (Signed)
[facility] order sent to [facility]. They will reach out to the patient to schedule.  

## 2020-10-06 NOTE — Telephone Encounter (Signed)
Patient called and left me a voicemail on my phone. I called her back no answer I left her a voicemail to call me back.

## 2020-10-09 ENCOUNTER — Encounter: Payer: Self-pay | Admitting: *Deleted

## 2020-10-15 ENCOUNTER — Telehealth: Payer: Self-pay | Admitting: Neurology

## 2020-10-15 NOTE — Telephone Encounter (Signed)
Pt has called back in response to vm left by Toma Copier, RN re: lab results

## 2020-10-15 NOTE — Telephone Encounter (Signed)
Called pt and LVM (ok per DPR) advising pt of lab results as follows: her labs were benign, but her diabetes marker called hemoglobin A1c was in the prediabetes range.  I asked the pt to please talk to her primary care physician about management of prediabetes and her risk for development of actual diabetes.  Also advised her vitamin B12 was elevated, this is typically from taking B12 injections or a oral supplement.  Another test is pending, we will update her if this is abnormal.  Left office number in message for patient to call back if she has any questions.  Results sent to Dr Hollice Espy.

## 2020-10-15 NOTE — Telephone Encounter (Signed)
Returned pt's call and LVM (ok per DPR) asking for call back if she has any questions. Advised detailed message was sent to her mychart and left on her voicemail previously as well.

## 2020-10-18 LAB — COMPREHENSIVE METABOLIC PANEL
ALT: 15 IU/L (ref 0–32)
AST: 13 IU/L (ref 0–40)
Albumin/Globulin Ratio: 1.9 (ref 1.2–2.2)
Albumin: 5 g/dL — ABNORMAL HIGH (ref 3.8–4.9)
Alkaline Phosphatase: 57 IU/L (ref 44–121)
BUN/Creatinine Ratio: 14 (ref 9–23)
BUN: 10 mg/dL (ref 6–24)
Bilirubin Total: 0.4 mg/dL (ref 0.0–1.2)
CO2: 28 mmol/L (ref 20–29)
Calcium: 10.5 mg/dL — ABNORMAL HIGH (ref 8.7–10.2)
Chloride: 99 mmol/L (ref 96–106)
Creatinine, Ser: 0.72 mg/dL (ref 0.57–1.00)
Globulin, Total: 2.6 g/dL (ref 1.5–4.5)
Glucose: 92 mg/dL (ref 65–99)
Potassium: 4.3 mmol/L (ref 3.5–5.2)
Sodium: 141 mmol/L (ref 134–144)
Total Protein: 7.6 g/dL (ref 6.0–8.5)
eGFR: 97 mL/min/{1.73_m2} (ref >59)

## 2020-10-18 LAB — HOMOCYSTEINE: Homocysteine: 8.9 umol/L (ref 0.0–14.5)

## 2020-10-18 LAB — VITAMIN D 25 HYDROXY (VIT D DEFICIENCY, FRACTURES): Vit D, 25-Hydroxy: 34.3 ng/mL (ref 30.0–100.0)

## 2020-10-18 LAB — AMMONIA

## 2020-10-18 LAB — RHEUMATOID FACTOR: Rheumatoid fact SerPl-aCnc: <10 IU/mL (ref <14.0)

## 2020-10-18 LAB — HGB A1C W/O EAG: Hgb A1c MFr Bld: 5.9 % — ABNORMAL HIGH (ref 4.8–5.6)

## 2020-10-18 LAB — SEDIMENTATION RATE: Sed Rate: 9 mm/hr (ref 0–40)

## 2020-10-18 LAB — B12 AND FOLATE PANEL
Folate: 15 ng/mL (ref >3.0)
Vitamin B-12: 1979 pg/mL — ABNORMAL HIGH (ref 232–1245)

## 2020-10-18 LAB — C-REACTIVE PROTEIN: CRP: <1 mg/L (ref 0–10)

## 2020-10-18 LAB — RPR: RPR Ser Ql: NONREACTIVE

## 2020-10-18 LAB — VITAMIN B6: Vitamin B6: 15 ug/L (ref 3.4–65.2)

## 2020-10-18 LAB — VITAMIN B1: Thiamine: 136.9 nmol/L (ref 66.5–200.0)

## 2020-10-18 LAB — TSH: TSH: 1.38 u[IU]/mL (ref 0.450–4.500)

## 2020-10-18 LAB — ANA W/REFLEX: Anti Nuclear Antibody (ANA): NEGATIVE

## 2020-12-11 NOTE — Telephone Encounter (Signed)
tricare order sent to [facility]

## 2020-12-23 ENCOUNTER — Telehealth: Payer: Self-pay | Admitting: Neurology

## 2020-12-23 NOTE — Telephone Encounter (Signed)
Pt called asking if a PA was sent to ins for an MRI without contrast. Pt requesting a call back.

## 2020-12-24 NOTE — Telephone Encounter (Signed)
I have never done a PA for [employer] for a MRI. I sent a message to [place] with [facility] imaging to see if she will help me on what I need to do. I am waiting to hear back from her.

## 2020-12-25 NOTE — Telephone Encounter (Signed)
I received a [employer] for [employer] form. I faxed the form along with the clinical notes. It is pending.

## 2021-01-13 NOTE — Telephone Encounter (Signed)
I called [employer] for [employer] and right now they will not approve the MRI because the diagnosis code that the primary care put in to send to [place] for our referral is not the same diagnosis code that Dr. Frances Furbish used to order the MRI. I left a message with the outgoing referral coordinator at Dr. Hollice Espy office the PCP to update the diagnosis code.    Dr. Hollice Espy ph # [phone].

## 2021-01-20 NOTE — Telephone Encounter (Signed)
Luanna Salk: [id number] (exp. 01/07/21 to 01/07/22)  Order sent to [facility], they will reach out to the patient to schedule.

## 2021-01-20 NOTE — Telephone Encounter (Signed)
Dr. Hollice Espy office put a new referral in for [employer] for our office. The auth number is [id number] (exp. 01/07/22).  I resubmitted another fax with [employer] for the MRI it is still pending.

## 2021-01-28 NOTE — Telephone Encounter (Signed)
The authorization is good for [facility], order faxed there they will reach out to the patient to scheduled.

## 2021-01-29 NOTE — Telephone Encounter (Signed)
scheduled at [facility] for 1/3 115pm

## 2021-02-11 ENCOUNTER — Other Ambulatory Visit

## 2021-02-17 ENCOUNTER — Telehealth: Payer: Self-pay | Admitting: Neurology

## 2021-02-17 NOTE — Telephone Encounter (Signed)
I called pt and relayed the MRI results , no intracranial pathology.  Noted minimal chronic SVD.  I relayed this caused by aging, hypertension, high cholesterol, diabetes.  Keep these under control. Eat heart healthy diet, exercise.  She can f/u with pcp.  She verbalized understanding. She appreciated call back.

## 2021-02-17 NOTE — Telephone Encounter (Signed)
I received her brain MRI report.  She had a brain MRI w/wo contrast through [facility] on 02/10/21.  Impression: No acute intracranial pathology identified.  Minimal chronic small vessel ischemic change.  Please call patient and advise her that her brain MRI with and without contrast from 02/10/2021 showed benign findings.  As discussed, she can follow-up with her primary care.

## 2021-03-25 ENCOUNTER — Encounter: Payer: Self-pay | Admitting: Family Medicine

## 2021-06-25 ENCOUNTER — Telehealth: Payer: Self-pay | Admitting: Internal Medicine

## 2021-06-25 NOTE — Telephone Encounter (Signed)
Made 3 attempts to schedule, removing from recall list. 

## 2021-10-08 ENCOUNTER — Encounter: Payer: Self-pay | Admitting: Nurse Practitioner

## 2021-11-09 ENCOUNTER — Encounter: Payer: Self-pay | Admitting: Nurse Practitioner

## 2021-11-09 ENCOUNTER — Ambulatory Visit (INDEPENDENT_AMBULATORY_CARE_PROVIDER_SITE_OTHER): Admitting: Nurse Practitioner

## 2021-11-09 VITALS — BP 140/80 | HR 70 | Ht 66.0 in | Wt 174.4 lb

## 2021-11-09 DIAGNOSIS — R0989 Other specified symptoms and signs involving the circulatory and respiratory systems: Secondary | ICD-10-CM | POA: Diagnosis not present

## 2021-11-09 DIAGNOSIS — Z1211 Encounter for screening for malignant neoplasm of colon: Secondary | ICD-10-CM

## 2021-11-09 MED ORDER — SUPREP BOWEL PREP KIT 17.5-3.13-1.6 GM/177ML PO SOLN: ORAL | 0 refills | Status: DC

## 2021-11-09 NOTE — Patient Instructions (Addendum)
If you are age 60 or older, your body mass index should be between 23-30. Your Body mass index is 28.15 kg/m. If this is out of the aforementioned range listed, please consider follow up with your Primary Care Provider.  If you are age 70 or younger, your body mass index should be between 19-25. Your Body mass index is 28.15 kg/m. If this is out of the aformentioned range listed, please consider follow up with your Primary Care Provider.   ________________________________________________________   Carolyn Patrick have been scheduled for an endoscopy and colonoscopy with Dr. Candis Schatz. Please follow written instructions given to you at your visit today. If you use inhalers (even only as needed), please bring them with you on the day of your procedure.  Please contact your insurance company regarding coverage for a screening colonoscopy.  Thank you for entrusting me with your care and for choosing [place], [place]

## 2021-11-09 NOTE — Progress Notes (Addendum)
11/10/2021 Carolyn Patrick [id number] 08-14-1960   CHIEF COMPLAINT: Constant throat clearing   HISTORY OF PRESENT ILLNESS: Carolyn Patrick is a 61 year old female with a past medical history of hypertension, hypercholesterolemia, coronary calcifications, kidney stones and GERD.  She presents to our office today as referred by Dr. Jene Every for further evaluation regarding throat clearing.She denies having any heartburn. She constantly clears her throat and she describes feeling "dry spots" in her throat which makes it more difficult to swallow. Sometimes takes more effort to swallow. Food does not get stuck in the esophagus. Brother died from esophageal cancer and another brother had Barrett's esophagus, both brothers were smokers. She is passing a normal brown formed stool most days. No rectal bleeding or black stools. Infrequent NSAID use. She underwent an EGD by Dr. Stan Head 09/18/2012 which was normal. A colonoscopy was done on the same date which showed a few diverticula to the transverse colon otherwise was normal. A repeat colonoscopy in 10 years was recommended.   She was seen by neurology 09/2020 to rule out neurological etiology for her throat clearing and shoulder twitching. No neurological disorder was identified to explain these symptoms which were possible due to stress and anxiety.   She is followed by cardiology due to having a history of hypertension, coronary artery calcifications and coronary artery calcification. She denies having any CP or SOB.   Limited TTE with Definity (05/04/17): Normal LV size with mild LVH.  LVEF 50-55% with normal wall motion. ETT (04/25/17): Good exercise capacity without significant ST/T changes or arrhythmias.  Exercised 13:30 min (17.5 METS) achieving 166 bpm (100% MPHR). TTE (04/25/17): Normal LV size and wall thicknes.  LVEF 45-50% (technically difficult study).  Normal RV size and function.  No significant valvular  abnormalities. Coronary calcium score: 14 (80th percentile for age/gender) in 2016, 3 in 2010   Past Medical History:  Diagnosis Date   Family history of esophageal cancer    GERD (gastroesophageal reflux disease)    Hypercholesteremia    Hypertension    Renal disorder    kidney stones   Vitamin D deficiency    Past Surgical History:  Procedure Laterality Date   CYSTOSCOPY W/ URETERAL STENT PLACEMENT Right 01/12/2015   Procedure: CYSTOSCOPY WITH RETROGRADE PYELOGRAM/URETERAL STENT PLACEMENT;  Surgeon: Franchot Gallo, MD;  Location: [facility];  Service: Urology;  Laterality: Right;   CYSTOSCOPY W/ URETERAL STENT REMOVAL Right 02/18/2015   Procedure: CYSTOSCOPY WITH RIGHT URETERAL STENT REMOVAL;  Surgeon: Franchot Gallo, MD;  Location: [facility];  Service: Urology;  Laterality: Right;   CYSTOSCOPY/URETEROSCOPY/HOLMIUM LASER Right 02/18/2015   Procedure: CYSTOSCOPY/RIGHT URETEROSCOPY/HOLMIUM LASER LITHOTRIPSY/RIGHT URETEROSCOPIC STONE EXTRACTION;  Surgeon: Franchot Gallo, MD;  Location: [facility];  Service: Urology;  Laterality: Right;   HOLMIUM LASER APPLICATION Right 0/24/0973   Procedure: HOLMIUM LASER APPLICATION;  Surgeon: Franchot Gallo, MD;  Location: [facility];  Service: Urology;  Laterality: Right;   KNEE SURGERY Right    arthroscopy and meniscal repair   Social History: Nonsmoker. Rare alcohol intake. No drug use.   Family History: family history includes Coronary artery disease (age of onset: 15) in her brother and brother; Coronary artery disease (age of onset: 4) in her mother; Esophageal cancer in her brother; Heart attack in her brother and brother; Heart attack (age of onset: 40) in her father; Heart attack (age of onset: 68) in her paternal grandfather; Heart disease in her father, maternal grandmother, and mother; Heart failure in her brother and brother;  Stroke in her mother.  No Known Allergies    Outpatient Encounter Medications as of 11/09/2021  Medication Sig    hydrochlorothiazide (HYDRODIURIL) 25 MG tablet Take 25 mg by mouth daily.   rosuvastatin (CRESTOR) 5 MG tablet Take 1 tablet (5 mg total) by mouth daily.   SUPREP BOWEL PREP KIT 17.5-3.13-1.6 GM/177ML SOLN Suprep-Use as directed   Vitamin D, Cholecalciferol, 25 MCG (1000 UT) TABS Take by mouth.   ergocalciferol (VITAMIN D2) 1.25 MG (50000 UT) capsule Take 1 capsule by mouth once a week. (Patient not taking: Reported on 11/09/2021)   No facility-administered encounter medications on file as of 11/09/2021.    REVIEW OF SYSTEMS:  Gen: Denies fever, sweats or chills. No weight loss.  CV: Denies chest pain, palpitations or edema. Resp: Denies cough, shortness of breath of hemoptysis.  GI: See HPI.  GU : Denies urinary burning, blood in urine, increased urinary frequency or incontinence. MS: Denies joint pain, muscles aches or weakness. Derm: Denies rash, itchiness, skin lesions or unhealing ulcers. Psych: Denies depression, anxiety or memory loss. Heme: Denies bruising, easy bleeding. Neuro:  Denies headaches, dizziness or paresthesias. Endo:  Denies any problems with DM, thyroid or adrenal function.  PHYSICAL EXAM: BP (!) 140/80   Pulse 70   Ht _0  (1.676 m)   Wt 174 lb 6.4 oz (79.1 kg)   SpO2 98%   BMI 28.15 kg/m   Wt Readings from Last 3 Encounters:  11/09/21 174 lb 6.4 oz (79.1 kg)  10/01/20 185 lb (83.9 kg)  08/06/20 184 lb (83.5 kg)    General: 61 year old female in NAD.  Head: Normocephalic and atraumatic. Eyes:  Sclerae non-icteric, conjunctive pink. Ears: Normal auditory acuity. Mouth: Dentition intact. No ulcers or lesions.  Neck: Supple, no lymphadenopathy or thyromegaly.  Lungs: Clear bilaterally to auscultation without wheezes, crackles or rhonchi. Heart: Regular rate and rhythm. No murmur, rub or gallop appreciated.  Abdomen: Soft, nontender, non distended. No masses. No hepatosplenomegaly. Normoactive bowel sounds x 4 quadrants.  Rectal: Deferred.   Musculoskeletal: Symmetrical with no gross deformities. Skin: Warm and dry. No rash or lesions on visible extremities. Extremities: No edema. Neurological: Alert oriented x 4. Left shoulder tic.  Psychological:  Alert and cooperative. Normal mood and affect.  ASSESSMENT AND PLAN:  71) 61 year old female with constant throat clearing, possible laryngeal reflux. Family history of esophageal cancer and Barrett's esophagus. Normal EGD 09/2012. -EGD benefits and risks discussed including risk with sedation, risk of bleeding, perforation and infection  -GERD diet  -Patient does not wish to take acid reducing medication at this time, await results of EGD  2) Colon cancer screening. Colonoscopy 09/2012 identified a few diverticula to the transverse colon, no polyps -Colonoscopy benefits and risks discussed including risk with sedation, risk of bleeding, perforation and infection  -Patient wishes to purse a colonoscopy at the time of EGD. Patient instructed to contact insurance carrier to verify if a screening colonoscopy covered prior to her 10 year recall date (09/2022)  3) History of coronary calcifications. No CP.   Further recommendations to be determined after the above evaluation completed           CC:  Jene Every, MD

## 2021-11-10 DIAGNOSIS — R0989 Other specified symptoms and signs involving the circulatory and respiratory systems: Secondary | ICD-10-CM | POA: Insufficient documentation

## 2021-11-12 NOTE — Progress Notes (Signed)
Agree with the assessment and plan as outlined by Colleen Kennedy-Smith, NP.    Westyn Driggers E. Geniyah Eischeid, MD [facility]  

## 2021-12-15 ENCOUNTER — Encounter: Payer: Self-pay | Admitting: Gastroenterology

## 2021-12-15 ENCOUNTER — Ambulatory Visit (AMBULATORY_SURGERY_CENTER): Admitting: Gastroenterology

## 2021-12-15 VITALS — BP 116/80 | HR 61 | Temp 98.0°F | Resp 12 | Ht 66.0 in | Wt 174.0 lb

## 2021-12-15 DIAGNOSIS — R0989 Other specified symptoms and signs involving the circulatory and respiratory systems: Secondary | ICD-10-CM

## 2021-12-15 DIAGNOSIS — K219 Gastro-esophageal reflux disease without esophagitis: Secondary | ICD-10-CM | POA: Diagnosis not present

## 2021-12-15 DIAGNOSIS — Z1211 Encounter for screening for malignant neoplasm of colon: Secondary | ICD-10-CM | POA: Diagnosis present

## 2021-12-15 DIAGNOSIS — R6889 Other general symptoms and signs: Secondary | ICD-10-CM

## 2021-12-15 MED ORDER — SODIUM CHLORIDE 0.9 % IV SOLN: 500.0000 mL | Freq: Once | INTRAVENOUS | Status: DC

## 2021-12-15 NOTE — Op Note (Signed)
[facility] Patient Name: Carolyn Patrick Procedure Date: 12/15/2021 8:49 AM MRN: [id number] Endoscopist: Lorin Picket E. Tomasa Rand , MD, [id number] Age: 61 Referring MD:  Date of Birth: 1960-12-25 Gender: Female Account #: [id number] Procedure:                Colonoscopy Indications:              Screening for colorectal malignant neoplasm (last                            colonoscopy was 10 years ago) Medicines:                Monitored Anesthesia Care Procedure:                Pre-Anesthesia Assessment:                           - Prior to the procedure, a History and Physical                            was performed, and patient medications and                            allergies were reviewed. The patient's tolerance of                            previous anesthesia was also reviewed. The risks                            and benefits of the procedure and the sedation                            options and risks were discussed with the patient.                            All questions were answered, and informed consent                            was obtained. Prior Anticoagulants: The patient has                            taken no anticoagulant or antiplatelet agents. ASA                            Grade Assessment: II - A patient with mild systemic                            disease. After reviewing the risks and benefits,                            the patient was deemed in satisfactory condition to                            undergo the procedure.  After obtaining informed consent, the colonoscope                            was passed under direct vision. Throughout the                            procedure, the patient's blood pressure, pulse, and                            oxygen saturations were monitored continuously. The                            Colonoscope was introduced through the anus and                            advanced to the the  terminal ileum, with                            identification of the appendiceal orifice and IC                            valve. The colonoscopy was performed without                            difficulty. The patient tolerated the procedure                            well. The quality of the bowel preparation was                            excellent. The terminal ileum, ileocecal valve,                            appendiceal orifice, and rectum were photographed.                            The bowel preparation used was SUPREP via split                            dose instruction. Scope In: 9:15:54 AM Scope Out: 9:25:42 AM Scope Withdrawal Time: 0 hours 7 minutes 33 seconds  Total Procedure Duration: 0 hours 9 minutes 48 seconds  Findings:                 The perianal and digital rectal examinations were                            normal. Pertinent negatives include normal                            sphincter tone and no palpable rectal lesions.                           Multiple medium-mouthed and small-mouthed  diverticula were found in the sigmoid colon and                            transverse colon.                           The exam was otherwise normal throughout the                            examined colon.                           The terminal ileum appeared normal.                           The retroflexed view of the distal rectum and anal                            verge was normal and showed no anal or rectal                            abnormalities. Complications:            No immediate complications. Estimated Blood Loss:     Estimated blood loss: none. Impression:               - Diverticulosis in the sigmoid colon and in the                            transverse colon.                           - The examined portion of the ileum was normal.                           - The distal rectum and anal verge are normal on                             retroflexion view.                           - No specimens collected. Recommendation:           - Patient has a contact number available for                            emergencies. The signs and symptoms of potential                            delayed complications were discussed with the                            patient. Return to normal activities tomorrow.                            Written discharge instructions were provided to the  patient.                           - Resume previous diet.                           - Continue present medications.                           - Repeat colonoscopy in 10 years for screening                            purposes. Mieko Kneebone E. Tomasa Rand, MD 12/15/2021 9:37:16 AM This report has been signed electronically.

## 2021-12-15 NOTE — Progress Notes (Signed)
Called to room to assist during endoscopic procedure.  Patient ID and intended procedure confirmed with present staff. Received instructions for my participation in the procedure from the performing physician.  

## 2021-12-15 NOTE — Progress Notes (Signed)
[facility] History and Physical   Primary Care Physician:  Jene Every, MD   Reason for Procedure:   Colon cancer screening, chronic globus sensation  Plan:    EGD, colonoscopy     HPI: Carolyn Patrick is a 61 y.o. female undergoing average risk screening colonoscopy.  She has chronic throat irritation and globus sensation but no chronic lower GI symptoms.  No dysphagia.  She had a colonoscopy in 2014 with diverticulosis, otherwise normal. She has a family history of esophageal cancer and Barrett's (brother), but no colon cancer. Past Medical History:  Diagnosis Date   Family history of esophageal cancer    GERD (gastroesophageal reflux disease)    Hypercholesteremia    Hypertension    Renal disorder    kidney stones   Vitamin D deficiency     Past Surgical History:  Procedure Laterality Date   COLONOSCOPY  09/18/2012   CYSTOSCOPY W/ URETERAL STENT PLACEMENT Right 01/12/2015   Procedure: CYSTOSCOPY WITH RETROGRADE PYELOGRAM/URETERAL STENT PLACEMENT;  Surgeon: Franchot Gallo, MD;  Location: [facility];  Service: Urology;  Laterality: Right;   CYSTOSCOPY W/ URETERAL STENT REMOVAL Right 02/18/2015   Procedure: CYSTOSCOPY WITH RIGHT URETERAL STENT REMOVAL;  Surgeon: Franchot Gallo, MD;  Location: [facility];  Service: Urology;  Laterality: Right;   CYSTOSCOPY/URETEROSCOPY/HOLMIUM LASER Right 02/18/2015   Procedure: CYSTOSCOPY/RIGHT URETEROSCOPY/HOLMIUM LASER LITHOTRIPSY/RIGHT URETEROSCOPIC STONE EXTRACTION;  Surgeon: Franchot Gallo, MD;  Location: [facility];  Service: Urology;  Laterality: Right;   HOLMIUM LASER APPLICATION Right 65/78/4696   Procedure: HOLMIUM LASER APPLICATION;  Surgeon: Franchot Gallo, MD;  Location: [facility];  Service: Urology;  Laterality: Right;   KNEE SURGERY Right    arthroscopy and meniscal repair    Prior to Admission medications   Medication Sig Start Date End Date Taking? Authorizing Provider  hydrochlorothiazide (HYDRODIURIL) 25 MG  tablet Take 25 mg by mouth daily. 08/15/20  Yes [provider]  rosuvastatin (CRESTOR) 5 MG tablet Take 1 tablet (5 mg total) by mouth daily. 08/06/20 12/15/21 Yes End, Harrell Gave, MD  Vitamin D, Cholecalciferol, 25 MCG (1000 UT) TABS Take by mouth.   Yes [provider]    Current Outpatient Medications  Medication Sig Dispense Refill   hydrochlorothiazide (HYDRODIURIL) 25 MG tablet Take 25 mg by mouth daily.     rosuvastatin (CRESTOR) 5 MG tablet Take 1 tablet (5 mg total) by mouth daily. 90 tablet 3   Vitamin D, Cholecalciferol, 25 MCG (1000 UT) TABS Take by mouth.     Current Facility-Administered Medications  Medication Dose Route Frequency Provider Last Rate Last Admin   0.9 %  sodium chloride infusion  500 mL Intravenous Once Daryel November, MD        Allergies as of 12/15/2021   (No Known Allergies)    Family History  Problem Relation Age of Onset   Heart disease Mother        Heart valve   Stroke Mother    Coronary artery disease Mother 7   Heart disease Father    Heart attack Father 35   Heart failure Brother    Coronary artery disease Brother 81   Heart attack Brother    Esophageal cancer Brother    Heart failure Brother    Coronary artery disease Brother 15       stents   Heart attack Brother    Heart disease Maternal Grandmother    Heart attack Paternal Grandfather 5   Colon cancer Neg Hx    Liver  disease Neg Hx    Colon polyps Neg Hx    Rectal cancer Neg Hx    Stomach cancer Neg Hx     Social History   Socioeconomic History   Marital status: Married    Spouse name: Not on file   Number of children: 2   Years of education: Not on file   Highest education level: Not on file  Occupational History   Occupation: retirer  Tobacco Use   Smoking status: Never   Smokeless tobacco: Never  Vaping Use   Vaping Use: Never used  Substance and Sexual Activity   Alcohol use: Yes    Comment: few drinks per year   Drug use: No    Sexual activity: Yes    Birth control/protection: None, Post-menopausal  Other Topics Concern   Not on file  Social History Narrative   Caffeine one cup daily.  Education : college degree, Chief Operating Officer, Work retired.     Social Determinants of Health   Financial Resource Strain: Not on file  Food Insecurity: Not on file  Transportation Needs: Not on file  Physical Activity: Not on file  Stress: Not on file  Social Connections: Not on file  Intimate Partner Violence: Not on file    Review of Systems:  All other review of systems negative except as mentioned in the HPI.  Physical Exam: Vital signs BP (!) 152/80   Pulse (!) 59   Temp 98 F (36.7 C) (Temporal)   Resp 16   Ht 5\' 6"  (1.676 m)   Wt 174 lb (78.9 kg)   LMP 12/15/2017 (Approximate)   SpO2 96%   BMI 28.08 kg/m   General:   Alert,  Well-developed, well-nourished, pleasant and cooperative in NAD Airway:  Mallampati 3 Lungs:  Clear throughout to auscultation.   Heart:  Regular rate and rhythm; no murmurs, clicks, rubs,  or gallops. Abdomen:  Soft, nontender and nondistended. Normal bowel sounds.   Neuro/Psych:  Normal mood and affect. A and O x 3   Makell Drohan E. 13/08/2017, MD [facility]

## 2021-12-15 NOTE — Op Note (Signed)
[facility] Patient Name: Carolyn Patrick Procedure Date: 12/15/2021 8:50 AM MRN: [id number] Endoscopist: Nicki Reaper E. Candis Schatz , MD, [id number] Age: 61 Referring MD:  Date of Birth: 30-Mar-1960 Gender: Female Account #: [id number] Procedure:                Upper GI endoscopy Indications:              Chronic cough, Globus sensation, no improvement                            with PPI, family history of esophageal cancer                            (brother) Medicines:                Monitored Anesthesia Care Procedure:                Pre-Anesthesia Assessment:                           - Prior to the procedure, a History and Physical                            was performed, and patient medications and                            allergies were reviewed. The patient's tolerance of                            previous anesthesia was also reviewed. The risks                            and benefits of the procedure and the sedation                            options and risks were discussed with the patient.                            All questions were answered, and informed consent                            was obtained. Prior Anticoagulants: The patient has                            taken no anticoagulant or antiplatelet agents. ASA                            Grade Assessment: II - A patient with mild systemic                            disease. After reviewing the risks and benefits,                            the patient was deemed in satisfactory condition to  undergo the procedure.                           After obtaining informed consent, the endoscope was                            passed under direct vision. Throughout the                            procedure, the patient's blood pressure, pulse, and                            oxygen saturations were monitored continuously. The                            GIF [id number] #[id number] was introduced through  the                            mouth, and advanced to the third part of duodenum.                            The upper GI endoscopy was accomplished without                            difficulty. The patient tolerated the procedure                            well. Scope In: Scope Out: Findings:                 The examined portions of the nasopharynx,                            oropharynx and larynx were normal.                           The Z-line was irregular. Biopsies were taken with                            a cold forceps for histology. Estimated blood loss                            was minimal.                           The gastroesophageal flap valve was visualized                            endoscopically and classified as Hill Grade I                            (prominent fold, tight to endoscope).                           The exam of the esophagus was otherwise normal.  The entire examined stomach was normal.                           The examined duodenum was normal. Complications:            No immediate complications. Estimated Blood Loss:     Estimated blood loss was minimal. Impression:               - The examined portions of the nasopharynx,                            oropharynx and larynx were normal.                           - Z-line irregular. Biopsied.                           - Gastroesophageal flap valve classified as Hill                            Grade I (prominent fold, tight to endoscope).                           - Normal stomach.                           - Normal examined duodenum. Recommendation:           - Patient has a contact number available for                            emergencies. The signs and symptoms of potential                            delayed complications were discussed with the                            patient. Return to normal activities tomorrow.                            Written discharge  instructions were provided to the                            patient.                           - Resume previous diet.                           - Continue present medications.                           - Await pathology results.                           - Further recommendations will be based on biopsy  results                           - pH/impedance testing could be considered to                            further evaluate the role of GERD in patient's                            symptoms. Tekoa Hamor E. Tomasa Rand, MD 12/15/2021 9:34:24 AM This report has been signed electronically.

## 2021-12-15 NOTE — Progress Notes (Signed)
Report to PACU, RN, vss, BBS= Clear.  

## 2021-12-15 NOTE — Patient Instructions (Signed)
Please read handouts provided. Continue present medications. Await pathology results. Repeat colonoscopy in 10 years for screening.   YOU HAD AN ENDOSCOPIC PROCEDURE TODAY AT THE [facility]:   Refer to the procedure report that was given to you for any specific questions about what was found during the examination.  If the procedure report does not answer your questions, please call your gastroenterologist to clarify.  If you requested that your care partner not be given the details of your procedure findings, then the procedure report has been included in a sealed envelope for you to review at your convenience later.  YOU SHOULD EXPECT: Some feelings of bloating in the abdomen. Passage of more gas than usual.  Walking can help get rid of the air that was put into your GI tract during the procedure and reduce the bloating. If you had a lower endoscopy (such as a colonoscopy or flexible sigmoidoscopy) you may notice spotting of blood in your stool or on the toilet paper. If you underwent a bowel prep for your procedure, you may not have a normal bowel movement for a few days.  Please Note:  You might notice some irritation and congestion in your nose or some drainage.  This is from the oxygen used during your procedure.  There is no need for concern and it should clear up in a day or so.  SYMPTOMS TO REPORT IMMEDIATELY:  Following lower endoscopy (colonoscopy or flexible sigmoidoscopy):  Excessive amounts of blood in the stool  Significant tenderness or worsening of abdominal pains  Swelling of the abdomen that is new, acute  Fever of 100F or higher  Following upper endoscopy (EGD)  Vomiting of blood or coffee ground material  New chest pain or pain under the shoulder blades  Painful or persistently difficult swallowing  New shortness of breath  Fever of 100F or higher  Black, tarry-looking stools  For urgent or emergent issues, a gastroenterologist can be reached at any  hour by calling [phone]. Do not use MyChart messaging for urgent concerns.    DIET:  We do recommend a small meal at first, but then you may proceed to your regular diet.  Drink plenty of fluids but you should avoid alcoholic beverages for 24 hours.  ACTIVITY:  You should plan to take it easy for the rest of today and you should NOT DRIVE or use heavy machinery until tomorrow (because of the sedation medicines used during the test).    FOLLOW UP: Our staff will call the number listed on your records the next business day following your procedure.  We will call around 7:15- 8:00 am to check on you and address any questions or concerns that you may have regarding the information given to you following your procedure. If we do not reach you, we will leave a message.     If any biopsies were taken you will be contacted by phone or by letter within the next 1-3 weeks.  Please call us at [phone] if you have not heard about the biopsies in 3 weeks.    SIGNATURES/CONFIDENTIALITY: You and/or your care partner have signed paperwork which will be entered into your electronic medical record.  These signatures attest to the fact that that the information above on your After Visit Summary has been reviewed and is understood.  Full responsibility of the confidentiality of this discharge information lies with you and/or your care-partner. 

## 2021-12-15 NOTE — Progress Notes (Signed)
Pt's states no medical or surgical changes since previsit or office visit. 

## 2021-12-16 ENCOUNTER — Telehealth: Payer: Self-pay | Admitting: *Deleted

## 2021-12-16 NOTE — Telephone Encounter (Signed)
Attempted f/u phone call. No answer. Left message. °

## 2021-12-21 NOTE — Progress Notes (Signed)
Ms. Carolyn Patrick, The biopsies taken from your esophagus showed changes consistent with mild reflux esophagitis, but did not show any changes suggestive of Barrett's esophagus.  This is good news.  These biopsies are suggestive that your cough symptoms may be related to reflux, but do not necessarily prove this.  I would recommend you continue to take acid suppressing medicine and follow antireflux lifestyle modifications (avoiding eating within 3 to 4 hours of bedtime, elevating the head of bed, avoiding foods and beverages known to exacerbate reflux symptoms). As discussed, there are further tests that can be done to solidify the role of reflux in her symptoms if desired.  In patients with refractory reflux symptoms, antireflux surgeries/procedures can be considered. Please follow-up with [place] in the office as needed to discuss further evaluation and management of your symptoms.

## 2023-05-23 ENCOUNTER — Other Ambulatory Visit (HOSPITAL_COMMUNITY): Payer: Self-pay | Admitting: Family Medicine

## 2023-05-23 DIAGNOSIS — E7800 Pure hypercholesterolemia, unspecified: Secondary | ICD-10-CM

## 2023-06-01 ENCOUNTER — Ambulatory Visit (HOSPITAL_BASED_OUTPATIENT_CLINIC_OR_DEPARTMENT_OTHER)
Admission: RE | Admit: 2023-06-01 | Discharge: 2023-06-01 | Disposition: A | Discharge status: Home / Self Care | Payer: Self-pay | Source: Ambulatory Visit | Attending: Family Medicine | Admitting: Family Medicine

## 2023-06-01 DIAGNOSIS — E7800 Pure hypercholesterolemia, unspecified: Secondary | ICD-10-CM | POA: Insufficient documentation
